# Patient Record
Sex: Female | Born: 1935 | Race: White | Hispanic: No | Smoking: Never smoker
Health system: Southern US, Community
[De-identification: ages and names within clinical notes are randomized; demographics above are authoritative.]

## PROBLEM LIST (undated history)

## (undated) DIAGNOSIS — I10 Essential (primary) hypertension: Secondary | ICD-10-CM

## (undated) DIAGNOSIS — C55 Malignant neoplasm of uterus, part unspecified: Secondary | ICD-10-CM

## (undated) DIAGNOSIS — R51 Headache: Secondary | ICD-10-CM

## (undated) DIAGNOSIS — G629 Polyneuropathy, unspecified: Secondary | ICD-10-CM

## (undated) HISTORY — PX: OTHER SURGICAL HISTORY: SHX169

## (undated) HISTORY — PX: ABDOMINAL HYSTERECTOMY: SHX81

## (undated) HISTORY — PX: VASCULAR SURGERY: SHX849

---

## 1974-07-09 HISTORY — PX: VEIN LIGATION AND STRIPPING: SHX2653

## 2006-03-18 ENCOUNTER — Ambulatory Visit (HOSPITAL_COMMUNITY): Admission: RE | Admit: 2006-03-18 | Discharge: 2006-03-18 | Payer: Self-pay | Admitting: Family Medicine

## 2006-06-21 ENCOUNTER — Ambulatory Visit: Payer: Self-pay | Admitting: Gastroenterology

## 2006-06-21 ENCOUNTER — Ambulatory Visit (HOSPITAL_COMMUNITY): Admission: RE | Admit: 2006-06-21 | Discharge: 2006-06-21 | Payer: Self-pay | Admitting: Gastroenterology

## 2008-05-19 ENCOUNTER — Ambulatory Visit (HOSPITAL_COMMUNITY): Admission: RE | Admit: 2008-05-19 | Discharge: 2008-05-19 | Payer: Self-pay | Admitting: Family Medicine

## 2008-08-21 ENCOUNTER — Emergency Department (HOSPITAL_COMMUNITY): Admission: EM | Admit: 2008-08-21 | Discharge: 2008-08-21 | Payer: Self-pay | Admitting: Emergency Medicine

## 2008-10-14 ENCOUNTER — Encounter (INDEPENDENT_AMBULATORY_CARE_PROVIDER_SITE_OTHER): Payer: Self-pay | Admitting: *Deleted

## 2008-10-14 LAB — CONVERTED CEMR LAB
BUN: 17 mg/dL
CO2: 26 meq/L
Calcium: 9.4 mg/dL
Cholesterol: 269 mg/dL
Free T4: 7.4 ng/dL
Glucose, Bld: 85 mg/dL
HDL: 36 mg/dL
Sodium: 137 meq/L

## 2008-11-11 ENCOUNTER — Emergency Department (HOSPITAL_COMMUNITY): Admission: EM | Admit: 2008-11-11 | Discharge: 2008-11-11 | Payer: Self-pay | Admitting: Emergency Medicine

## 2008-11-18 ENCOUNTER — Ambulatory Visit: Payer: Self-pay | Admitting: Cardiology

## 2008-11-25 ENCOUNTER — Encounter: Payer: Self-pay | Admitting: Physician Assistant

## 2008-11-25 ENCOUNTER — Ambulatory Visit: Payer: Self-pay | Admitting: Cardiology

## 2008-11-30 ENCOUNTER — Telehealth: Payer: Self-pay | Admitting: Cardiology

## 2008-12-09 ENCOUNTER — Encounter: Payer: Self-pay | Admitting: Physician Assistant

## 2008-12-09 ENCOUNTER — Ambulatory Visit: Payer: Self-pay | Admitting: Cardiology

## 2008-12-10 ENCOUNTER — Telehealth: Payer: Self-pay | Admitting: Cardiology

## 2008-12-16 ENCOUNTER — Ambulatory Visit: Payer: Self-pay | Admitting: Cardiology

## 2008-12-17 ENCOUNTER — Ambulatory Visit: Payer: Self-pay | Admitting: Cardiology

## 2008-12-17 ENCOUNTER — Encounter: Payer: Self-pay | Admitting: Physician Assistant

## 2008-12-18 ENCOUNTER — Telehealth: Payer: Self-pay | Admitting: Physician Assistant

## 2008-12-18 ENCOUNTER — Emergency Department (HOSPITAL_COMMUNITY): Admission: EM | Admit: 2008-12-18 | Discharge: 2008-12-18 | Payer: Self-pay | Admitting: Emergency Medicine

## 2008-12-20 ENCOUNTER — Encounter: Payer: Self-pay | Admitting: Cardiology

## 2008-12-23 ENCOUNTER — Telehealth (INDEPENDENT_AMBULATORY_CARE_PROVIDER_SITE_OTHER): Payer: Self-pay | Admitting: Nurse Practitioner

## 2008-12-28 ENCOUNTER — Ambulatory Visit (HOSPITAL_COMMUNITY): Admission: RE | Admit: 2008-12-28 | Discharge: 2008-12-28 | Payer: Self-pay | Admitting: Family Medicine

## 2008-12-30 ENCOUNTER — Telehealth: Payer: Self-pay | Admitting: Cardiology

## 2009-01-03 ENCOUNTER — Encounter: Payer: Self-pay | Admitting: Cardiology

## 2009-01-03 ENCOUNTER — Ambulatory Visit: Payer: Self-pay | Admitting: Cardiology

## 2009-01-12 ENCOUNTER — Emergency Department (HOSPITAL_COMMUNITY): Admission: EM | Admit: 2009-01-12 | Discharge: 2009-01-12 | Payer: Self-pay | Admitting: Emergency Medicine

## 2009-02-03 ENCOUNTER — Ambulatory Visit: Payer: Self-pay | Admitting: Cardiology

## 2009-02-24 ENCOUNTER — Telehealth (INDEPENDENT_AMBULATORY_CARE_PROVIDER_SITE_OTHER): Payer: Self-pay | Admitting: *Deleted

## 2009-03-01 ENCOUNTER — Telehealth: Payer: Self-pay | Admitting: Cardiology

## 2009-03-03 ENCOUNTER — Encounter: Payer: Self-pay | Admitting: Cardiology

## 2009-03-03 LAB — CONVERTED CEMR LAB
BUN: 19 mg/dL
Calcium: 8.9 mg/dL
Glucose, Bld: 117 mg/dL

## 2009-03-16 ENCOUNTER — Ambulatory Visit: Payer: Self-pay | Admitting: Cardiology

## 2009-03-16 ENCOUNTER — Encounter: Payer: Self-pay | Admitting: Adult Health

## 2009-03-21 ENCOUNTER — Ambulatory Visit: Payer: Self-pay

## 2009-03-24 ENCOUNTER — Ambulatory Visit: Payer: Self-pay | Admitting: Cardiology

## 2009-03-24 ENCOUNTER — Ambulatory Visit (HOSPITAL_COMMUNITY): Admission: RE | Admit: 2009-03-24 | Discharge: 2009-03-24 | Payer: Self-pay | Admitting: Cardiology

## 2009-03-24 ENCOUNTER — Encounter: Payer: Self-pay | Admitting: Cardiology

## 2009-04-01 ENCOUNTER — Ambulatory Visit: Payer: Self-pay | Admitting: Cardiology

## 2009-04-01 DIAGNOSIS — E785 Hyperlipidemia, unspecified: Secondary | ICD-10-CM

## 2009-04-01 DIAGNOSIS — R0789 Other chest pain: Secondary | ICD-10-CM | POA: Insufficient documentation

## 2009-04-13 ENCOUNTER — Emergency Department (HOSPITAL_COMMUNITY): Admission: EM | Admit: 2009-04-13 | Discharge: 2009-04-13 | Payer: Self-pay | Admitting: Emergency Medicine

## 2009-04-13 ENCOUNTER — Telehealth (INDEPENDENT_AMBULATORY_CARE_PROVIDER_SITE_OTHER): Payer: Self-pay

## 2009-04-17 ENCOUNTER — Emergency Department (HOSPITAL_COMMUNITY): Admission: EM | Admit: 2009-04-17 | Discharge: 2009-04-17 | Payer: Self-pay | Admitting: Emergency Medicine

## 2009-05-06 ENCOUNTER — Encounter (INDEPENDENT_AMBULATORY_CARE_PROVIDER_SITE_OTHER): Payer: Self-pay | Admitting: *Deleted

## 2009-05-06 LAB — CONVERTED CEMR LAB
BUN: 14 mg/dL
CO2: 24 meq/L
Chloride: 101 meq/L
Creatinine, Ser: 0.89 mg/dL

## 2009-05-09 ENCOUNTER — Emergency Department (HOSPITAL_COMMUNITY): Admission: EM | Admit: 2009-05-09 | Discharge: 2009-05-09 | Payer: Self-pay | Admitting: Emergency Medicine

## 2009-05-09 ENCOUNTER — Encounter (INDEPENDENT_AMBULATORY_CARE_PROVIDER_SITE_OTHER): Payer: Self-pay

## 2009-05-09 LAB — CONVERTED CEMR LAB
AST: 29 units/L
Alkaline Phosphatase: 87 units/L
CO2: 29 meq/L
Creatinine, Ser: 0.9 mg/dL
Total Protein: 7.2 g/dL
Troponin I: <0.05 ng/mL

## 2009-05-11 ENCOUNTER — Ambulatory Visit: Payer: Self-pay | Admitting: Adult Health

## 2009-05-11 ENCOUNTER — Encounter (INDEPENDENT_AMBULATORY_CARE_PROVIDER_SITE_OTHER): Payer: Self-pay

## 2009-05-20 ENCOUNTER — Ambulatory Visit: Admission: RE | Admit: 2009-05-20 | Discharge: 2009-05-20 | Payer: Self-pay | Admitting: Cardiology

## 2009-05-30 ENCOUNTER — Ambulatory Visit: Payer: Self-pay | Admitting: Pulmonary Disease

## 2009-06-15 ENCOUNTER — Encounter (INDEPENDENT_AMBULATORY_CARE_PROVIDER_SITE_OTHER): Payer: Self-pay | Admitting: *Deleted

## 2009-06-16 ENCOUNTER — Encounter (INDEPENDENT_AMBULATORY_CARE_PROVIDER_SITE_OTHER): Payer: Self-pay | Admitting: *Deleted

## 2009-06-16 ENCOUNTER — Ambulatory Visit: Payer: Self-pay | Admitting: Cardiology

## 2009-08-01 ENCOUNTER — Encounter: Payer: Self-pay | Admitting: Adult Health

## 2009-08-04 ENCOUNTER — Encounter: Payer: Self-pay | Admitting: Cardiology

## 2009-08-16 ENCOUNTER — Telehealth (INDEPENDENT_AMBULATORY_CARE_PROVIDER_SITE_OTHER): Payer: Self-pay | Admitting: *Deleted

## 2009-09-16 ENCOUNTER — Telehealth (INDEPENDENT_AMBULATORY_CARE_PROVIDER_SITE_OTHER): Payer: Self-pay | Admitting: *Deleted

## 2009-09-16 ENCOUNTER — Emergency Department (HOSPITAL_COMMUNITY): Admission: EM | Admit: 2009-09-16 | Discharge: 2009-09-16 | Payer: Self-pay | Admitting: Emergency Medicine

## 2009-11-04 ENCOUNTER — Encounter (INDEPENDENT_AMBULATORY_CARE_PROVIDER_SITE_OTHER): Payer: Self-pay | Admitting: *Deleted

## 2009-11-04 LAB — CONVERTED CEMR LAB
Alkaline Phosphatase: 76 units/L
BUN: 16 mg/dL
Calcium: 9.1 mg/dL
Creatinine, Ser: 1.09 mg/dL
Glucose, Bld: 86 mg/dL
HCT: 45.1 %
Hemoglobin: 15.1 g/dL
Platelets: 235 10*3/uL
Sodium: 140 meq/L
Total Protein: 7.2 g/dL

## 2009-11-05 ENCOUNTER — Encounter: Payer: Self-pay | Admitting: Cardiology

## 2009-11-05 LAB — CONVERTED CEMR LAB
AST: 35 units/L (ref 0–37)
Albumin: 4.2 g/dL (ref 3.5–5.2)
BUN: 16 mg/dL (ref 6–23)
Basophils Relative: 0 % (ref 0–1)
Calcium: 9.1 mg/dL (ref 8.4–10.5)
Chloride: 100 meq/L (ref 96–112)
Eosinophils Absolute: 0.1 10*3/uL (ref 0.0–0.7)
Eosinophils Relative: 2 % (ref 0–5)
HCT: 45.1 % (ref 36.0–46.0)
Hemoglobin: 15.1 g/dL — ABNORMAL HIGH (ref 12.0–15.0)
Lymphocytes Relative: 30 % (ref 12–46)
MCHC: 33.5 g/dL (ref 30.0–36.0)
Monocytes Relative: 11 % (ref 3–12)
Neutrophils Relative %: 57 % (ref 43–77)
Platelets: 235 10*3/uL (ref 150–400)
RBC: 5.04 M/uL (ref 3.87–5.11)
RDW: 12.9 % (ref 11.5–15.5)

## 2009-11-07 ENCOUNTER — Encounter (INDEPENDENT_AMBULATORY_CARE_PROVIDER_SITE_OTHER): Payer: Self-pay | Admitting: *Deleted

## 2009-11-09 IMAGING — CT CT HEAD W/O CM
1 series · 16 of 30 positions shown, 20 images · non-contrast
Comparison: None

CLINICAL DATA: Hypertension, head discomfort.

CT HEAD WITHOUT CONTRAST
TECHNIQUE: Contiguous axial images were obtained from the base of
the skull through the vertex without contrast.

[Series 2: headseq 4.8 h37s · axial · 0.45mm/px · z∈[+92,+252]mm · 16 of 36 slices shown, 20 images]
[im 2/36  brain]
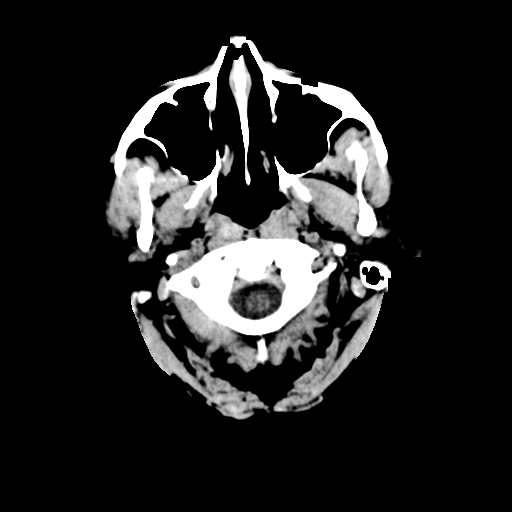
[im 2/36  bone]
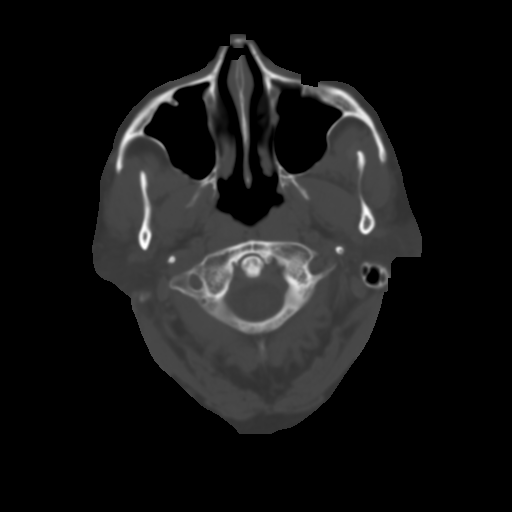
[im 4/36  brain]
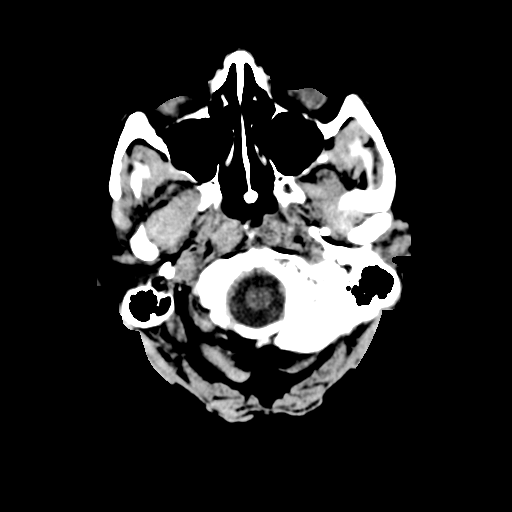
[im 7/36  brain]
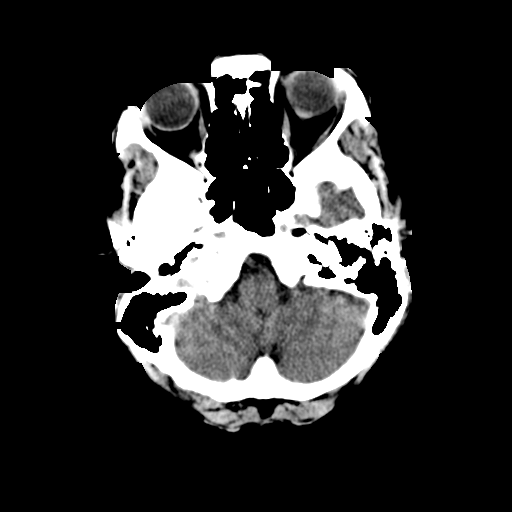
[im 9/36  brain]
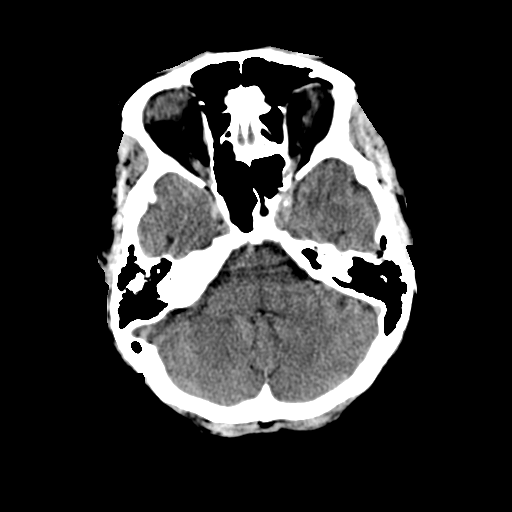
[im 10/36  brain]
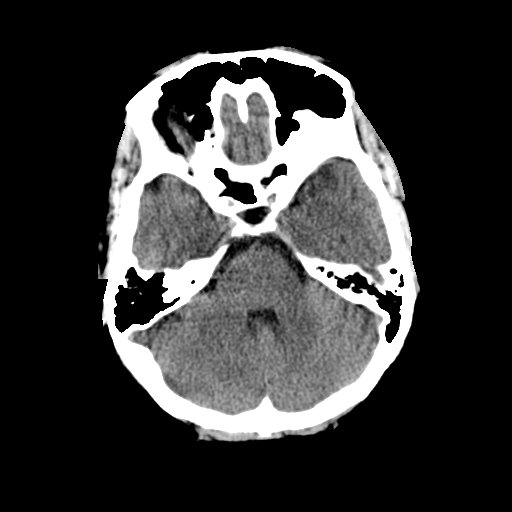
[im 10/36  bone]
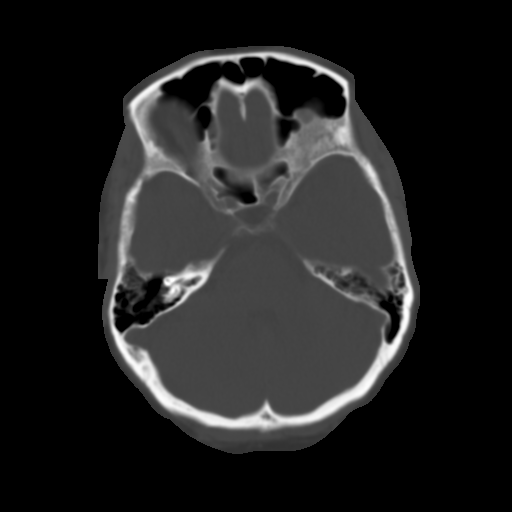
[im 13/36  brain]
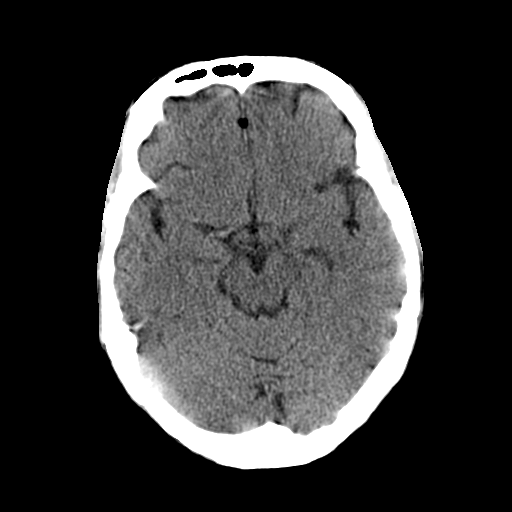
[im 15/36  brain]
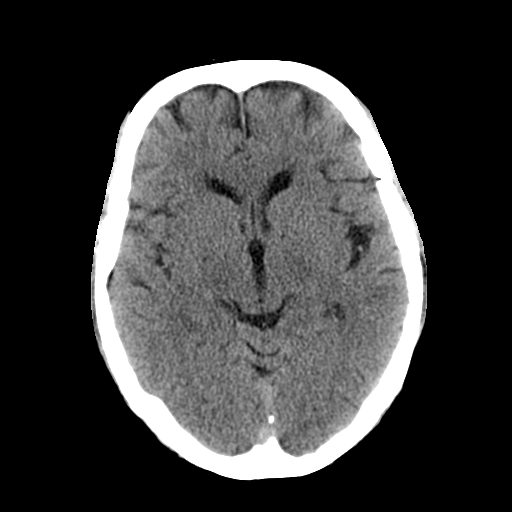
[im 17/36  brain]
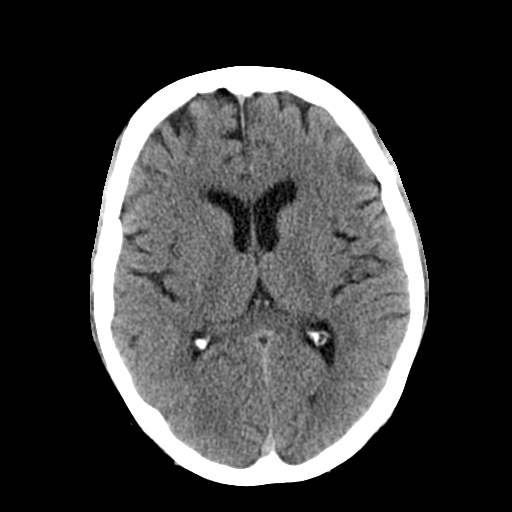
[im 19/36  brain]
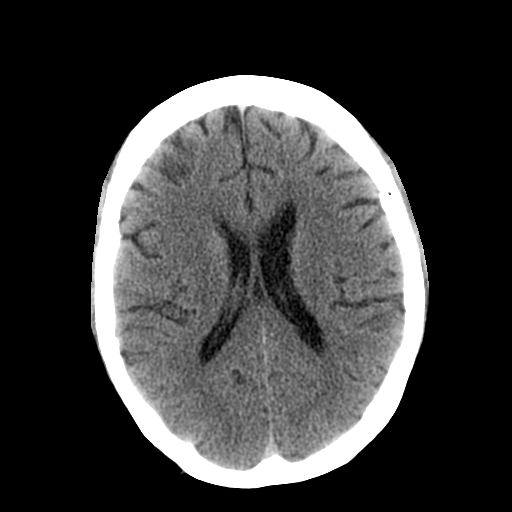
[im 19/36  bone]
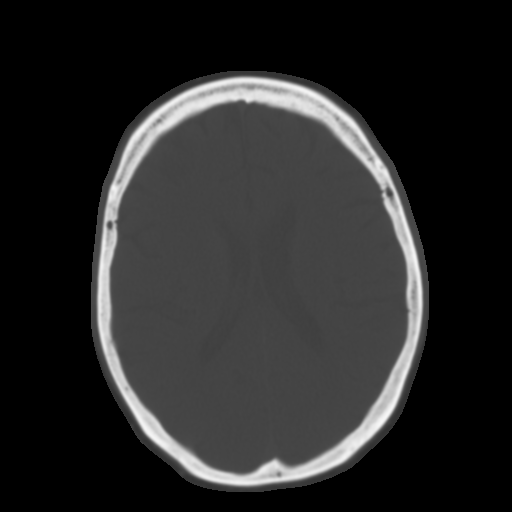
[im 21/36  brain]
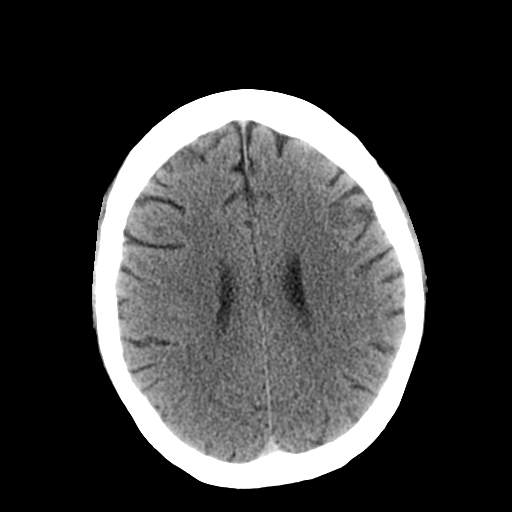
[im 23/36  brain]
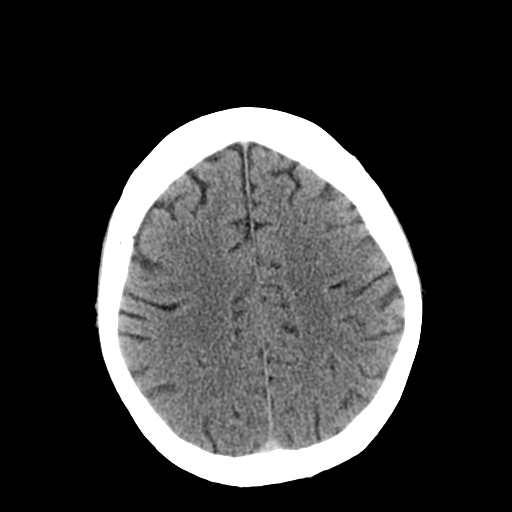
[im 26/36  brain]
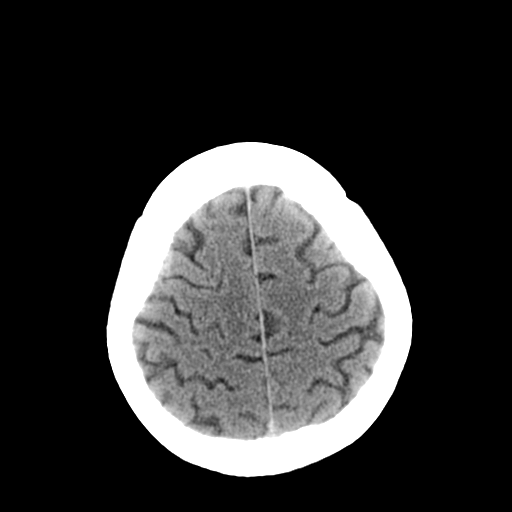
[im 27/36  brain]
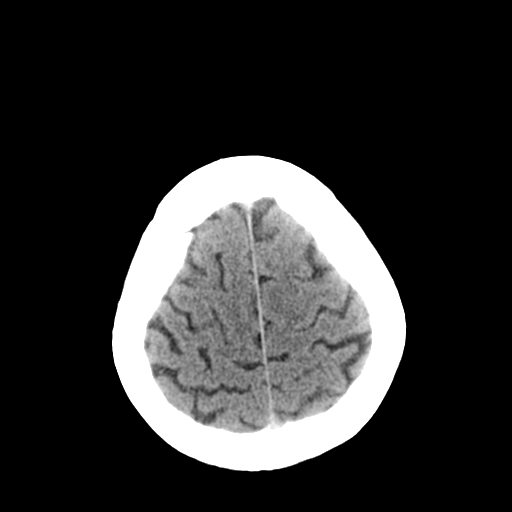
[im 27/36  bone]
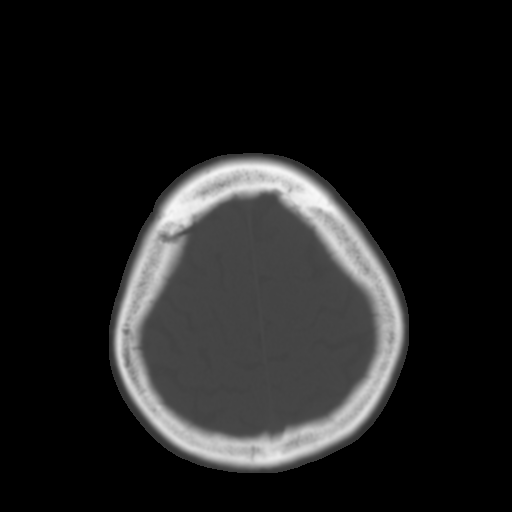
[im 29/36  brain]
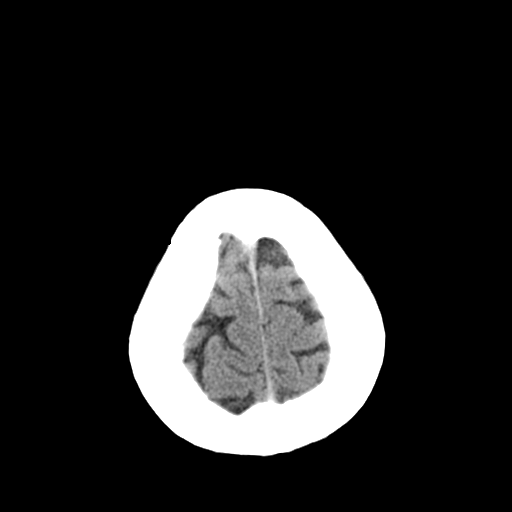
[im 32/36  brain]
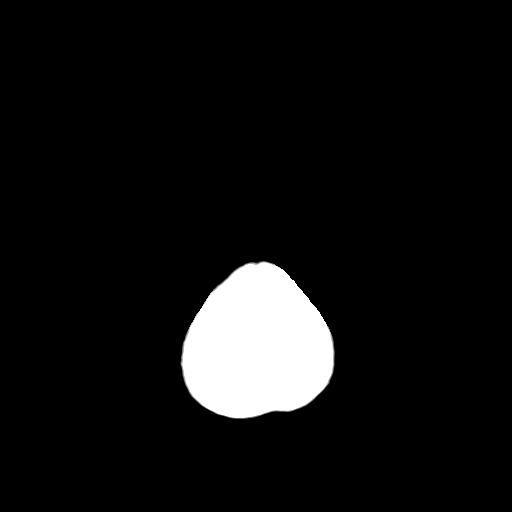
[im 34/36  brain]
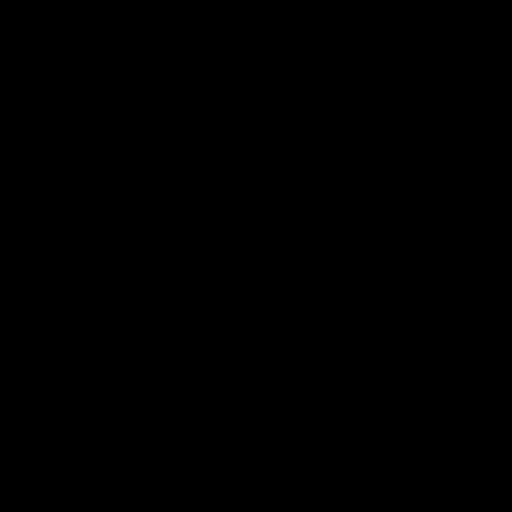

[16 of 30 positions shown; findings below may reference images not displayed]

FINDINGS: No acute intracranial abnormalities are identified,
including mass lesion or mass effect, hydrocephalus, extra-axial
fluid collection, midline shift, hemorrhage, or acute infarction.
Please note that acute infarction may be occult on CT for 24-48
hours.

The visualized bony calvarium is unremarkable.
The visualized paranasal sinuses are clear.
IMPRESSION: No evidence of acute intracranial abnormality.

## 2009-12-21 ENCOUNTER — Telehealth (INDEPENDENT_AMBULATORY_CARE_PROVIDER_SITE_OTHER): Payer: Self-pay | Admitting: *Deleted

## 2009-12-21 ENCOUNTER — Encounter (INDEPENDENT_AMBULATORY_CARE_PROVIDER_SITE_OTHER): Payer: Self-pay | Admitting: *Deleted

## 2010-02-01 ENCOUNTER — Ambulatory Visit: Payer: Self-pay | Admitting: Cardiology

## 2010-02-01 DIAGNOSIS — R42 Dizziness and giddiness: Secondary | ICD-10-CM | POA: Insufficient documentation

## 2010-02-01 DIAGNOSIS — F411 Generalized anxiety disorder: Secondary | ICD-10-CM | POA: Insufficient documentation

## 2010-06-23 ENCOUNTER — Encounter: Payer: Self-pay | Admitting: Adult Health

## 2010-08-08 NOTE — Progress Notes (Signed)
Summary: TEST RESULTS  Phone Note Call from Patient Call back at Home Phone [phone]   Caller: PT Reason for Call: Lab or Test Results Summary of Call: PT NEVER GOT LAB RESULTS FOR MAY Initial call taken by: Faythe Ghee,  December 21, 2009 10:59 AM  Follow-up for Phone Call        Phone Call Completed, results given, results letter sent Follow-up by: Teressa Lower RN,  December 21, 2009 11:04 AM

## 2010-08-08 NOTE — Letter (Signed)
Summary: [place] Results Engineer, agricultural at [facility]  [address], [place] [address]   Phone: [phone]  Fax: [phone]      August 04, 2009 MRN: [id number]   [name] [address] [place], [place]  [address]   Dear Ms. Phebus,  Your test ordered by Selena Batten has been reviewed by your physician (or physician assistant) and was found to be normal or stable. Your physician (or physician assistant) felt no changes were needed at this time.  ____ Echocardiogram  ____ Cardiac Stress Test  ____ Lab Work  ____ Peripheral vascular study of arms, legs or neck  ____ CT scan or X-ray  ____ Lung or Breathing test  __X__ Other: Sleep Study Please continue on current medical treatment.  Thank you.   Marfa Bing, MD, F.A.C.C

## 2010-08-08 NOTE — Letter (Signed)
Summary: [place] Results Engineer, agricultural at [facility]  [address], [place] [address]   Phone: [phone]  Fax: [phone]      December 21, 2009 MRN: [id number]   [name] [address] [place], [place]  [address]   Dear [name],  Your test ordered by Selena Batten has been reviewed by your physician (or physician assistant) and was found to be normal or stable. Your physician (or physician assistant) felt no changes were needed at this time.  ____ Echocardiogram  ____ Cardiac Stress Test  __x__ Lab Work  ____ Peripheral vascular study of arms, legs or neck  ____ CT scan or X-ray  ____ Lung or Breathing test  ____ Other:  No change in medical treatment at this time, per Dr. Dietrich Pates.  Enclosed is a copy of your labwork for your records.  Thank you, Haji Delaine Allyne Gee RN    Asotin Bing, MD, Lenise Arena.C.Gaylord Shih, MD, F.A.C.C Lewayne Bunting, MD, F.A.C.C Nona Dell, MD, F.A.C.C Charlton Haws, MD, Lenise Arena.C.C

## 2010-08-08 NOTE — Miscellaneous (Signed)
Summary: Sleep Study-05-20-09  Clinical Lists Changes  Observations: Added new observation of EKG INTERP: IMPRESSIONS-RECOMMENDATIONS:   1. Small numbers of obstructive events which do not meet the apnea-       hypopnea index criteria for the obstructive sleep apnea syndrome.       The patient did have loud snoring throughout the night and only       transient oxygen desaturation.   2. Occasional premature ventricular contraction but no clinically       significant arrhythmias noted.   3. Moderate numbers of periodic leg movements, none of which resulted       in arousal or awakening.  This is most likely clinically       insignificant.               Barbaraann Share, MD,FCCP   Diplomate, American Board of Sleep   Medicine   Electronically Signed  (05/20/2009 16:03)      EKG  Procedure date:  05/20/2009  Findings:      IMPRESSIONS-RECOMMENDATIONS:   1. Small numbers of obstructive events which do not meet the apnea-       hypopnea index criteria for the obstructive sleep apnea syndrome.       The patient did have loud snoring throughout the night and only       transient oxygen desaturation.   2. Occasional premature ventricular contraction but no clinically       significant arrhythmias noted.   3. Moderate numbers of periodic leg movements, none of which resulted       in arousal or awakening.  This is most likely clinically       insignificant.               Barbaraann Share, MD,FCCP   Diplomate, American Board of Sleep   Medicine   Electronically Signed

## 2010-08-08 NOTE — Assessment & Plan Note (Signed)
Summary: ROV   Visit Type:  Follow-up Primary Provider:  Dr. Viviann Spare [facility]   History of Present Illness: [name] returns as scheduled for continued assessment and treatment of hypertension.  She is more relaxed concerning her blood pressure than she has been previously and were satisfied with recent results.  Aliskirin was discontinued, as the patient was convinced that use of this medication was resulting in increased blood pressure.  She is only manipulating her medications minimally, adjusting her dose of clonidine by 0.05 mg per day depending upon blood pressure.  She has developed no new medical problems, has not been hospitalized and has not been seen in the emergency department.  She notes anxiety resulting from stress related to assisting with the care and treatment of her daughter who has severe hepatitis C and is on a transplant list.  Preventive Screening-Counseling & Management  Alcohol-Tobacco     Smoking Status: never  Current Medications (verified): 1)  Aspirin 81 Mg Tbec (Aspirin) .... Take One Tablet By Mouth Daily 2)  Clonidine Hcl 0.1 Mg Tabs (Clonidine Hcl) .... Take 1 Am 1/2 Pm 3)  Ativan 0.5 Mg Tabs (Lorazepam) .... As Needed 4)  Benicar Hct 40-12.5 Mg Tabs (Olmesartan Medoxomil-Hctz) .Marland Kitchen.. 1 Tab Everyday At Lunch Time. 5)  Abc Plus  Tabs (Multiple Vitamins-Minerals) .Marland Kitchen.. 1 Tab Daily 6)  Fish Oil  Oil (Fish Oil) .... One Tablet By Mouth Twice A Day 7)  Zocor 20 Mg Tabs (Simvastatin) .Marland Kitchen.. 1 Tab Once Daily  Allergies (verified): 1)  ! * Decongestants 2)  ! Sulfa 3)  ! * Exforge  Past History:  PMH, FH, and Social History reviewed and updated.  Past Medical History: Hypertension Dyslipidemia Allergic Rhinitis Gastroesophageal reflux disease  Social History: Smoking Status:  never  Vital Signs:  Patient profile:   75 year old female Weight:      227 pounds BMI:     41.67 Pulse rate:   70 / minute BP sitting:   131 / 73  (right  arm)  Vitals Entered By: Dreama Saa, CNA (February 01, 2010 1:55 PM)  Physical Exam  General:  Obese; well developed; no acute distress;   Neck-No JVD, no carotid bruits: Lungs-No tachypnea, no rales; no rhonchi;no wheezes:  Cardiovascular-normal PMI; normal S1 and S2; S4 and modest systolic murmur present Abdomen-BS normal; soft and non-tender without masses or organomegaly:  Skin-Warm, no significant lesions: Extremities-Nl distal pulses; trace ankle edema:    Impression & Recommendations:  Problem # 1:  HYPERTENSION (ICD-401.1) Blood pressure control is good, and patient is satisfied with her current regime.  She does describe mild sedation with clonidine, but is willing to live with this.  Should that change, I would substitute transdermal clonidine at the lowest dose.  [name] also complains of postnasal drip, but is not inclined to use an antihistamine because she fears additive sedation.  I directed her to Allegra and Claritin over-the-counter suggesting that these may well not affect her level of alertness at all.  I do not believe this nice woman requires routine cardiology followup, but will be available to see her at any time her physicians deem appropriate.  Patient Instructions: 1)  Your physician recommends that you schedule a follow-up appointment in: as needed  2)  Your physician recommends that you continue on your current medications as directed. Please refer to the Current Medication list given to you today.   Prevention & Chronic Care Immunizations   Influenza vaccine: Not documented  Tetanus booster: Not documented    Pneumococcal vaccine: Not documented    H. zoster vaccine: Not documented  Colorectal Screening   Hemoccult: Not documented    Colonoscopy: Not documented  Other Screening   Pap smear: Not documented    Mammogram: Not documented    DXA bone density scan: Not documented   Smoking status: never  (02/01/2010)  Lipids   Total  Cholesterol: 269  (10/14/2008)   LDL: Not documented   LDL Direct: Not documented   HDL: 36  (10/14/2008)   Triglycerides: 448  (10/14/2008)    SGOT (AST): 35  (11/05/2009)   SGPT (ALT): 42  (11/05/2009)   Alkaline phosphatase: 76  (11/05/2009)   Total bilirubin: 0.6  (11/05/2009)  Hypertension   Last Blood Pressure: 131 / 73  (02/01/2010)   Serum creatinine: 1.09  (11/05/2009)   Serum potassium 4.0  (11/05/2009)  Self-Management Support :    Hypertension self-management support: Not documented    Lipid self-management support: Not documented

## 2010-08-08 NOTE — Miscellaneous (Signed)
Summary: LABS CBCD,CMP,11/04/2009  Clinical Lists Changes  Observations: Added new observation of CALCIUM: 9.1 mg/dL (16/04/9603 54:09) Added new observation of ALBUMIN: 4.2 g/dL (81/19/1478 29:56) Added new observation of PROTEIN, TOT: 7.2 g/dL (21/30/8657 84:69) Added new observation of SGPT (ALT): 42 units/L (11/04/2009 10:35) Added new observation of SGOT (AST): 35 units/L (11/04/2009 10:35) Added new observation of ALK PHOS: 76 units/L (11/04/2009 10:35) Added new observation of CREATININE: 1.09 mg/dL (62/95/2841 32:44) Added new observation of BUN: 16 mg/dL (07/11/7251 66:44) Added new observation of BG RANDOM: 86 mg/dL (03/47/4259 56:38) Added new observation of CO2 PLSM/SER: 26 meq/L (11/04/2009 10:35) Added new observation of CL SERUM: 100 meq/L (11/04/2009 10:35) Added new observation of K SERUM: 4.0 meq/L (11/04/2009 10:35) Added new observation of NA: 140 meq/L (11/04/2009 10:35) Added new observation of PLATELETK/UL: 235 K/uL (11/04/2009 10:35) Added new observation of MCV: 89.5 fL (11/04/2009 10:35) Added new observation of HCT: 45.1 % (11/04/2009 10:35) Added new observation of HGB: 15.1 g/dL (75/64/3329 51:88) Added new observation of WBC COUNT: 5.7 10*3/microliter (11/04/2009 10:35)

## 2010-08-08 NOTE — Progress Notes (Signed)
Summary: questions about meds  Phone Note Call from Patient Call back at Home Phone [phone]   Caller: pt Reason for Call: Talk to Nurse Summary of Call: patient has been having stomic issues and wants to know if it could be due to some of the meds she is on. Initial call taken by: Faythe Ghee,  August 16, 2009 10:37 AM  Follow-up for Phone Call        Patient states that she has been having intestinal pain that has kept her up most all night the past couple of nights, she is so weak she can hardly get up out of a chair and elevated BP. Mrs. Norby states that she called her PCP (Dr. Leandrew Koyanagi) and was advised to call us due to PCP leaving early today. She thinks s/s may be caused from [place]. Please advise. Also, patient was advised to go to ED if pain worsens. Follow-up by: Larita Fife Via LPN,  August 16, 2009 11:25 AM  Additional Follow-up for Phone Call Additional follow up Details #1::        She needs to be seen by her PMD or Gastroenterologist. Additional Follow-up by: Kathlen Brunswick, MD, [facility],  August 16, 2009 4:25 PM    Additional Follow-up for Phone Call Additional follow up Details #2::    Patient advised. She states she just returned from ED at [facility]. where she was also advised to see her PCP and Gastroenterologist. Is seeing PCP in 2 days. Follow-up by: Larita Fife Via LPN,  August 16, 2009 4:47 PM

## 2010-08-08 NOTE — Progress Notes (Signed)
Summary: bp 190/100  Phone Note Call from Patient Call back at Home Phone [phone]   Reason for Call: Talk to Nurse Summary of Call: BP has been high 190/100 at the highest. Initial call taken by: Faythe Ghee,  September 16, 2009 12:17 PM  Follow-up for Phone Call        bp at this time 160/73, 65,   pt has the most problems at night and when trying to go to sleep.   Pt sets her clock and gets up every 4 hrs at night.  She has a new pcp Dr. Leandrew [facility]agi [facility] in [place]. Pt feels weak and unable to stand still. Follow-up by: Teressa Lower RN,  September 16, 2009 4:55 PM  Additional Follow-up for Phone Call Additional follow up Details #1::        I encouraged pt no only take her clonidine as prescribed and to come to ED this weekend if she felt her bp was out of control Additional Follow-up by: Teressa Lower RN,  September 16, 2009 5:00 PM     Appended Document: bp 190/100 pt still having trouble walking and weakness, questions about switching meds  Appended Document: bp 190/100 Pt states that pcp will not handle blood pressure issues without consent from cardiologist.  Pt will not be able to be seen in this office until May, pt may be treated by pcp for bp issues until seen in our office in May

## 2010-08-10 NOTE — Miscellaneous (Signed)
  Clinical Lists Changes  Medications: Changed medication from BENICAR HCT 40-12.5 MG TABS (OLMESARTAN MEDOXOMIL-HCTZ) 1 tab everyday at lunch time. to LOSARTAN POTASSIUM-HCTZ 50-12.5 MG TABS (LOSARTAN POTASSIUM-HCTZ) Take 1 tablet by mouth once a day - Signed Rx of LOSARTAN POTASSIUM-HCTZ 50-12.5 MG TABS (LOSARTAN POTASSIUM-HCTZ) Take 1 tablet by mouth once a day;  #30 x 3;  Signed;  Entered by: Fuller Plan CMA;  Authorized by: Joni Reining, NP;  Method used: Electronically to [facility] [address], [place], [place], [place]  [address], Ph: [phone] or [phone], Fax: [phone]    Prescriptions: LOSARTAN POTASSIUM-HCTZ 50-12.5 MG TABS (LOSARTAN POTASSIUM-HCTZ) Take 1 tablet by mouth once a day  #30 x 3   Entered by:   Fuller Plan CMA   Authorized by:   Joni Reining, NP   Signed by:   Fuller Plan CMA on 06/23/2010   Method used:   Electronically to        [facility] [address] (retail)       [address]       [place], [place]  [address]       Ph: [phone] or [phone]       Fax: [phone]   RxID:   [id number]

## 2010-10-11 LAB — COMPREHENSIVE METABOLIC PANEL
AST: 29 U/L (ref 0–37)
Albumin: 3.7 g/dL (ref 3.5–5.2)
CO2: 29 mEq/L (ref 19–32)
GFR calc non Af Amer: >60 mL/min (ref >60)
Glucose, Bld: 111 mg/dL — ABNORMAL HIGH (ref 70–99)
Potassium: 4.5 mEq/L (ref 3.5–5.1)
Sodium: 137 mEq/L (ref 135–145)
Total Bilirubin: 0.8 mg/dL (ref 0.3–1.2)

## 2010-10-11 LAB — POCT CARDIAC MARKERS
CKMB, poc: 1.2 ng/mL (ref 1.0–8.0)
Troponin i, poc: <0.05 ng/mL (ref 0.00–0.09)

## 2010-10-12 LAB — URINALYSIS, ROUTINE W REFLEX MICROSCOPIC
Bilirubin Urine: NEGATIVE
Glucose, UA: NEGATIVE mg/dL
Ketones, ur: NEGATIVE mg/dL
Nitrite: NEGATIVE
Protein, ur: NEGATIVE mg/dL
Specific Gravity, Urine: 1.01 (ref 1.005–1.030)
Urobilinogen, UA: 0.2 mg/dL (ref 0.0–1.0)
pH: 6.5 (ref 5.0–8.0)

## 2010-10-12 LAB — URINE CULTURE: Colony Count: 60000

## 2010-10-12 LAB — POCT I-STAT, CHEM 8
BUN: 16 mg/dL (ref 6–23)
Calcium, Ion: 1.01 mmol/L — ABNORMAL LOW (ref 1.12–1.32)
Chloride: 108 mEq/L (ref 96–112)
Glucose, Bld: 107 mg/dL — ABNORMAL HIGH (ref 70–99)
HCT: 44 % (ref 36.0–46.0)
Sodium: 138 mEq/L (ref 135–145)
TCO2: 25 mmol/L (ref 0–100)

## 2010-10-12 LAB — POCT CARDIAC MARKERS
CKMB, poc: 1.2 ng/mL (ref 1.0–8.0)
Myoglobin, poc: 87.6 ng/mL (ref 12–200)
Troponin i, poc: <0.05 ng/mL (ref 0.00–0.09)

## 2010-10-12 LAB — URINE MICROSCOPIC-ADD ON

## 2010-10-16 LAB — CBC
HCT: 44.8 % (ref 36.0–46.0)
Hemoglobin: 15.9 g/dL — ABNORMAL HIGH (ref 12.0–15.0)
MCHC: 35.4 g/dL (ref 30.0–36.0)
Platelets: 261 10*3/uL (ref 150–400)
RDW: 13.5 % (ref 11.5–15.5)
WBC: 7.5 10*3/uL (ref 4.0–10.5)

## 2010-10-16 LAB — DIFFERENTIAL
Basophils Relative: 0 % (ref 0–1)
Lymphocytes Relative: 20 % (ref 12–46)
Monocytes Absolute: 0.5 10*3/uL (ref 0.1–1.0)
Monocytes Relative: 7 % (ref 3–12)
Neutro Abs: 5.4 10*3/uL (ref 1.7–7.7)
Neutrophils Relative %: 72 % (ref 43–77)

## 2010-10-16 LAB — URINALYSIS, ROUTINE W REFLEX MICROSCOPIC
Ketones, ur: NEGATIVE mg/dL
Protein, ur: NEGATIVE mg/dL
Specific Gravity, Urine: 1.01 (ref 1.005–1.030)
Urobilinogen, UA: 0.2 mg/dL (ref 0.0–1.0)

## 2010-10-16 LAB — URINE MICROSCOPIC-ADD ON

## 2010-10-16 LAB — BASIC METABOLIC PANEL: Glucose, Bld: 102 mg/dL — ABNORMAL HIGH (ref 70–99)

## 2010-10-24 LAB — COMPREHENSIVE METABOLIC PANEL
Alkaline Phosphatase: 112 U/L (ref 39–117)
BUN: 17 mg/dL (ref 6–23)
Chloride: 104 mEq/L (ref 96–112)
Creatinine, Ser: 0.97 mg/dL (ref 0.4–1.2)
GFR calc non Af Amer: 57 mL/min — ABNORMAL LOW (ref >60)
Glucose, Bld: 118 mg/dL — ABNORMAL HIGH (ref 70–99)
Potassium: 4.6 mEq/L (ref 3.5–5.1)
Total Bilirubin: 0.5 mg/dL (ref 0.3–1.2)

## 2010-10-24 LAB — URINALYSIS, ROUTINE W REFLEX MICROSCOPIC
Bilirubin Urine: NEGATIVE
Hgb urine dipstick: NEGATIVE
Nitrite: NEGATIVE
Specific Gravity, Urine: 1.02 (ref 1.005–1.030)
Urobilinogen, UA: 0.2 mg/dL (ref 0.0–1.0)
pH: 5.5 (ref 5.0–8.0)

## 2010-10-24 LAB — URINE CULTURE

## 2010-10-24 LAB — POCT CARDIAC MARKERS
CKMB, poc: <1 ng/mL — ABNORMAL LOW (ref 1.0–8.0)
CKMB, poc: <1 ng/mL — ABNORMAL LOW (ref 1.0–8.0)
Myoglobin, poc: 75 ng/mL (ref 12–200)

## 2010-10-24 LAB — DIFFERENTIAL
Basophils Absolute: 0 10*3/uL (ref 0.0–0.1)
Basophils Relative: 0 % (ref 0–1)
Neutro Abs: 5.1 10*3/uL (ref 1.7–7.7)
Neutrophils Relative %: 72 % (ref 43–77)

## 2010-10-24 LAB — CBC
HCT: 47.4 % — ABNORMAL HIGH (ref 36.0–46.0)
Hemoglobin: 15.9 g/dL — ABNORMAL HIGH (ref 12.0–15.0)
MCV: 87.4 fL (ref 78.0–100.0)
WBC: 7.1 10*3/uL (ref 4.0–10.5)

## 2010-10-24 LAB — URINE MICROSCOPIC-ADD ON

## 2010-10-26 ENCOUNTER — Other Ambulatory Visit: Payer: Self-pay | Admitting: Adult Health

## 2010-11-21 NOTE — Letter (Signed)
February 03, 2009    Angus G. Renard Matter, MD  [address]  [place] [address]   RE:  JENSYN, SHAVE  MRN:  [id number]  /  DOB:  Jun 07, 1936   Dear Thalia Party,   Ms. Rickerson returns to the office for continued assessment and treatment  of hypertension.  Since her last visit, she has done better.  Most of  the values she obtains at home had been normal.  She uses clonidine on a  p.r.n. basis and has only done so on a few occasions.  She reports some  nasal congestion today, but attributes that to allergies.  She does not  appear to be experiencing any adverse effects to her current  antihypertensive medications, which include Benicar/HCT 40/12.5 mg  daily, Tekturna 150 mg daily, clonidine 0.1 mg b.i.d. p.r.n.  She also  reports that she continues to take metoprolol 25 mg b.i.d., although she  complained of adverse effects three visits ago and was advised to stop  that medication.   PHYSICAL EXAMINATION:  GENERAL:  Very pleasant, talkative woman in no  acute distress.  VITAL SIGNS:  The weight is 226, unchanged.  Blood pressure 125/65,  heart rate 70 and regular, respirations 12 and unlabored.  NECK:  No jugular venous distention; no carotid bruits.  LUNGS:  Clear.  CARDIAC:  Normal first and second heart sounds; fourth heart sound  present.  ABDOMEN:  Soft and nontender; no organomegaly.  EXTREMITIES:  No edema.   IMPRESSION:  Ms. Vanderloop appears to be doing well on her current regime.  I have not scheduled a routine followup visit, but have advised her to  call me at anytime that she has further problems with her blood  pressure.  Likewise, please let me know if I can do anything more to  assist in her care.    Sincerely,      Gerrit Friends. Dietrich Pates, MD, [facility]  Electronically Signed    RMR/MedQ  DD: 02/03/2009  DT: 02/04/2009  Job #: [id number]

## 2010-11-21 NOTE — Assessment & Plan Note (Signed)
[facility]                                 ON-CALL NOTE   [name], [name]                      MRN:          [id number]  DATE:04/13/2009                            DOB:          [birth year]    I was contacted by [name] because of elevated systolic blood  pressures at home.  In looking in the computer, she is a 76 year old  female with a history of hypertension, who sees Dr. Dietrich Pates.  She  reports taking clonidine 0.1 mg b.i.d. as well as Benicar 40 mg daily.  This evening, her blood pressure was approximately 180.  Later this  evening, has increased blood pressure to 217/110 and asking about her  symptoms, she states that she feels terrible.  She has a burning in  her head as well as mild chest discomfort and shortness of breath.  I  instructed her that given these symptoms associated with severe  hypertension, she needs to be seen and evaluated in the emergency  department.  She will be calling emergency medical services who will be  transferring her to the emergency department and further recommendations  will be made pending the emergency department evaluation.     Therisa Doyne, MD    SJT/MedQ  DD: 04/13/2009  DT: 04/13/2009  Job #: [id number]

## 2010-11-21 NOTE — Letter (Signed)
Nov 18, 2008    Angus G. Renard Matter, MD  [address]  [place], [place] [address]   RE:  [name], [name]  MRN:  [id number]  /  DOB:  04-13-1936   Dear Thalia Party,   It was my pleasure evaluating Ms. Couchman in the office today in  consultation at your request for hypertension out of control.  I first  saw this nice woman in 2004, when she had been admitted to [facility] with chest pain.  Myocardial infarction had been ruled out.  I  scheduled her for stress testing, but she never returned for her study  or followup.  She apparently has done well since then with no  significant major medical problems.  Blood pressure control was good  until recent weeks when she has noted elevation, particularly in the  early morning hours.  She recently was evaluated in the emergency  department for this problem.  She has been on multiple medications of  late including Exforge, which resulted in myalgias, Benicar,  hydrochlorothiazide, which caused dry mouth and clonidine p.r.n.  She  was given a prescription for metoprolol 25 mg b.i.d. last week, but has  not yet started it.  She sought consultation with her daughter's  physician, who advised her that her current therapy appear to be  appropriate.  Consideration of pheochromocytoma and renal artery  stenosis has been raised, but no testing has yet been performed for  these entities.  Ms. Niday reports being symptomatic when her blood  pressure is very high, but her symptoms seemed to be anxiety related to  her concern about her blood pressure.  She has not had headaches, visual  disturbance, renal insufficiency nor any other hypertension-related  morbidity.  Family member recently suffered a CVA.  She is concerned  about this possibly happening to her.   ALLERGIES:  She reports multiple allergies including to DECONGESTANTS,  SULFA DRUGS, and EXFORGE.   CURRENT MEDICATIONS:  Simvastatin 20 mg daily,  Benicar/hydrochlorothiazide 40/12.5 mg  daily, aspirin 81 mg daily, and a  multivitamin.  She uses lorazepam 0.5 mg p.r.n.Marland Kitchen   PAST MEDICAL HISTORY:  Otherwise notable for GERD and allergic rhinitis.   PAST SURGICAL HISTORY:  Her only surgery was a minor orthopedic  procedure on her heel in 1992.   SOCIAL HISTORY:  Widowed, with three adult children.  No use of tobacco  products nor alcohol.   FAMILY HISTORY:  Father died due to respiratory failure; mother had  senile dementia.   REVIEW OF SYSTEMS:  Notable for occasional dizziness, sensation of  warmth in her face when her blood pressure is elevated, the need for  corrective lenses, class II exertional dyspnea, and intermittent mild  pedal edema.  She reports an allergy to MOLD.  All other systems  reviewed and are negative.   PHYSICAL EXAMINATION:  GENERAL:  Overwrought overweight pleasant woman  in no acute distress.  VITAL SIGNS:  The weight is 224 pounds, 6 pounds more than 6 years ago.  Blood pressure 145/85, heart rate 75 and regular, respirations 14 and  unlabored.  HEENT:  Anicteric sclerae; normal lids and conjunctivae.  NECK:  No jugular venous distention; normal carotid upstrokes without  bruits.  LUNGS:  Clear.  CARDIAC:  Normal first and second heart sounds; fourth heart sound  present.  ABDOMEN:  Soft and nontender; normal bowel sounds; no masses; no  organomegaly.  EXTREMITIES:  Distal pulses intact; no edema.  NEUROLOGIC:  Symmetric strength and tone;  normal cranial nerves.  SKIN:  No significant lesions.   LABORATORY DATA:  No recent laboratory data available; records requested  from Dr. Renard Matter.   IMPRESSION:  Ms. Bo has longstanding hypertension that has escaped  control.  While pheochromocytoma or renal artery stenosis are certainly  possible explanations, statistically, this probably simply represents  progression of her essential hypertension.  She will start metoprolol 25  mg b.i.d. as you have advised.  She will continue to take her  blood  pressure at home, but will come to see the Cardiology nurse once a week  for downloading of her blood pressure values  and verification of the accuracy of her device.  I will adjust her  medication as required and plan to see this nice woman again in 3 weeks.  She will not take clonidine or antihistamines for the time being, as  these are what probably caused her dry mouth, not an increased dose of  hydrochlorothiazide.    Sincerely,      Gerrit Friends. Dietrich Pates, MD, [facility]  Electronically Signed    RMR/MedQ  DD: 11/18/2008  DT: 11/19/2008  Job #: [id number]

## 2010-11-24 NOTE — Op Note (Signed)
NAMEEMA, HEBNER               ACCOUNT NO.:  [id number]   MEDICAL RECORD NO.:  [id number]          PATIENT TYPE:  AMB   LOCATION:  DAY                           FACILITY:  [facility]   PHYSICIAN:  Kassie Mends, M.D.      DATE OF BIRTH:  25-Jun-1936   DATE OF PROCEDURE:  06/21/2006  DATE OF DISCHARGE:                               OPERATIVE REPORT   OPERATION/PROCEDURE:  Colonoscopy.   INDICATIONS:  Mrs. [name] is a 75 year old female who presents for  average-risk colon cancer screening.   FINDINGS:  1. Sigmoid colon diverticulosis.  Otherwise normal colon without      evidence of polyps, masses, inflammatory changes, or AVMs.  2. Normal retroflexed view of the rectum.   RECOMMENDATIONS:  1. High fiber diet.  Handout given on high fiber diet and      diverticulosis.  2. Screening colonoscopy in 10 years.  3. Followup with Dr. Renard Matter.   MEDICATIONS:  1. Demerol 75 mg IV.  2. Versed 3 mg IV.   DESCRIPTION OF PROCEDURE:  The physical examination was performed and  informed consent was obtained from the patient after explaining the  benefits, risks and alternatives to the procedure.  The patient was  connected to the monitor and placed in the left lateral position.  Continuous oxygen was provided by nasal cannula and IV medicine  administered through an indwelling cannula.  After administration of  sedation and the rectal exam, the patient's rectum was intubated and  scope was advanced under direct visualization to the cecum.  The scope  was subsequently removed slowly by carefully examining color, texture,  anatomy, and integrity of the mucosa on the way out.  The patient was  recovered in the endoscopy suite and discharged to home in satisfactory  condition.      Kassie Mends, M.D.  Electronically Signed    SM/MEDQ  D:  06/21/2006  T:  06/21/2006  Job:  [id number]   cc:   Angus G. Renard Matter, MD  Fax: [phone]

## 2011-02-21 ENCOUNTER — Other Ambulatory Visit: Payer: Self-pay | Admitting: Cardiology

## 2011-06-13 ENCOUNTER — Observation Stay (HOSPITAL_COMMUNITY)
Admission: EM | Admit: 2011-06-13 | Discharge: 2011-06-17 | Disposition: A | Discharge status: Home / Self Care | Payer: Medicare Other | Attending: Internal Medicine | Admitting: Internal Medicine

## 2011-06-13 DIAGNOSIS — E785 Hyperlipidemia, unspecified: Secondary | ICD-10-CM | POA: Insufficient documentation

## 2011-06-13 DIAGNOSIS — G08 Intracranial and intraspinal phlebitis and thrombophlebitis: Secondary | ICD-10-CM | POA: Insufficient documentation

## 2011-06-13 DIAGNOSIS — H4920 Sixth [abducent] nerve palsy, unspecified eye: Secondary | ICD-10-CM | POA: Diagnosis present

## 2011-06-13 DIAGNOSIS — R51 Headache: Secondary | ICD-10-CM | POA: Insufficient documentation

## 2011-06-13 DIAGNOSIS — J323 Chronic sphenoidal sinusitis: Principal | ICD-10-CM | POA: Insufficient documentation

## 2011-06-13 DIAGNOSIS — C55 Malignant neoplasm of uterus, part unspecified: Secondary | ICD-10-CM | POA: Insufficient documentation

## 2011-06-13 DIAGNOSIS — J329 Chronic sinusitis, unspecified: Secondary | ICD-10-CM

## 2011-06-13 DIAGNOSIS — C313 Malignant neoplasm of sphenoid sinus: Secondary | ICD-10-CM | POA: Insufficient documentation

## 2011-06-13 DIAGNOSIS — H538 Other visual disturbances: Secondary | ICD-10-CM | POA: Insufficient documentation

## 2011-06-13 DIAGNOSIS — Z792 Long term (current) use of antibiotics: Secondary | ICD-10-CM | POA: Insufficient documentation

## 2011-06-13 DIAGNOSIS — D491 Neoplasm of unspecified behavior of respiratory system: Secondary | ICD-10-CM | POA: Diagnosis present

## 2011-06-13 DIAGNOSIS — R519 Headache, unspecified: Secondary | ICD-10-CM | POA: Diagnosis present

## 2011-06-13 DIAGNOSIS — Z79899 Other long term (current) drug therapy: Secondary | ICD-10-CM | POA: Insufficient documentation

## 2011-06-13 DIAGNOSIS — I10 Essential (primary) hypertension: Secondary | ICD-10-CM | POA: Diagnosis present

## 2011-06-13 HISTORY — DX: Polyneuropathy, unspecified: G62.9

## 2011-06-13 HISTORY — DX: Headache: R51

## 2011-06-13 HISTORY — DX: Malignant neoplasm of uterus, part unspecified: C55

## 2011-06-13 HISTORY — DX: Essential (primary) hypertension: I10

## 2011-06-14 ENCOUNTER — Other Ambulatory Visit (HOSPITAL_COMMUNITY): Payer: PRIVATE HEALTH INSURANCE

## 2011-06-14 ENCOUNTER — Observation Stay (HOSPITAL_COMMUNITY): Payer: Medicare Other

## 2011-06-14 ENCOUNTER — Encounter: Payer: Self-pay | Admitting: Emergency Medicine

## 2011-06-14 ENCOUNTER — Other Ambulatory Visit: Payer: Self-pay

## 2011-06-14 ENCOUNTER — Emergency Department (HOSPITAL_COMMUNITY): Payer: Medicare Other

## 2011-06-14 DIAGNOSIS — R519 Headache, unspecified: Secondary | ICD-10-CM | POA: Diagnosis present

## 2011-06-14 DIAGNOSIS — R51 Headache: Secondary | ICD-10-CM | POA: Diagnosis present

## 2011-06-14 DIAGNOSIS — H4920 Sixth [abducent] nerve palsy, unspecified eye: Secondary | ICD-10-CM | POA: Diagnosis present

## 2011-06-14 DIAGNOSIS — I10 Essential (primary) hypertension: Secondary | ICD-10-CM | POA: Diagnosis present

## 2011-06-14 LAB — COMPREHENSIVE METABOLIC PANEL
Alkaline Phosphatase: 140 U/L — ABNORMAL HIGH (ref 39–117)
BUN: 14 mg/dL (ref 6–23)
Chloride: 95 mEq/L — ABNORMAL LOW (ref 96–112)
GFR calc Af Amer: 79 mL/min — ABNORMAL LOW (ref >90)
Glucose, Bld: 102 mg/dL — ABNORMAL HIGH (ref 70–99)
Potassium: 3.7 mEq/L (ref 3.5–5.1)
Total Bilirubin: 0.5 mg/dL (ref 0.3–1.2)
Total Protein: 7.1 g/dL (ref 6.0–8.3)

## 2011-06-14 LAB — CBC
HCT: 40.2 % (ref 36.0–46.0)
Hemoglobin: 14.6 g/dL (ref 12.0–15.0)
MCH: 31.2 pg (ref 26.0–34.0)
RBC: 4.68 MIL/uL (ref 3.87–5.11)

## 2011-06-14 LAB — TSH: TSH: 2.294 u[IU]/mL (ref 0.350–4.500)

## 2011-06-14 LAB — DIFFERENTIAL
Eosinophils Absolute: 0.4 10*3/uL (ref 0.0–0.7)
Lymphs Abs: 2 10*3/uL (ref 0.7–4.0)
Monocytes Relative: 7 % (ref 3–12)
Neutrophils Relative %: 67 % (ref 43–77)

## 2011-06-14 MED ORDER — SIMVASTATIN 20 MG PO TABS
20.0000 mg | ORAL_TABLET | Freq: Every day | ORAL | Status: DC
  Administered 2011-06-14: 20 mg via ORAL
  Filled 2011-06-14 (×2): qty 1

## 2011-06-14 MED ORDER — OXYCODONE HCL 5 MG PO TABS: 5.0000 mg | ORAL_TABLET | ORAL | Status: DC | PRN

## 2011-06-14 MED ORDER — OMEGA-3 FATTY ACIDS 1000 MG PO CAPS: 1.0000 g | ORAL_CAPSULE | Freq: Two times a day (BID) | ORAL | Status: DC

## 2011-06-14 MED ORDER — LOSARTAN POTASSIUM 50 MG PO TABS
50.0000 mg | ORAL_TABLET | Freq: Every day | ORAL | Status: DC
  Administered 2011-06-14 – 2011-06-16 (×3): 50 mg via ORAL
  Filled 2011-06-14 (×4): qty 1

## 2011-06-14 MED ORDER — THERA M PLUS PO TABS
1.0000 | ORAL_TABLET | Freq: Every day | ORAL | Status: DC
  Administered 2011-06-14 – 2011-06-17 (×3): 1 via ORAL
  Filled 2011-06-14 (×4): qty 1

## 2011-06-14 MED ORDER — OXYCODONE-ACETAMINOPHEN 5-325 MG PO TABS
2.0000 | ORAL_TABLET | Freq: Once | ORAL | Status: AC
  Administered 2011-06-14: 2 via ORAL
  Filled 2011-06-14: qty 2

## 2011-06-14 MED ORDER — FLUTICASONE PROPIONATE 50 MCG/ACT NA SUSP
2.0000 | Freq: Every day | NASAL | Status: DC
  Administered 2011-06-14 – 2011-06-15 (×2): 2 via NASAL
  Filled 2011-06-14: qty 16

## 2011-06-14 MED ORDER — ONDANSETRON HCL 4 MG/2ML IJ SOLN
4.0000 mg | Freq: Four times a day (QID) | INTRAMUSCULAR | Status: DC | PRN
  Administered 2011-06-14: 4 mg via INTRAVENOUS
  Filled 2011-06-14: qty 2

## 2011-06-14 MED ORDER — LOSARTAN POTASSIUM-HCTZ 50-12.5 MG PO TABS: 1.0000 | ORAL_TABLET | Freq: Every day | ORAL | Status: DC

## 2011-06-14 MED ORDER — OMEGA-3-ACID ETHYL ESTERS 1 G PO CAPS
1.0000 g | ORAL_CAPSULE | Freq: Two times a day (BID) | ORAL | Status: DC
  Administered 2011-06-14 – 2011-06-17 (×6): 1 g via ORAL
  Filled 2011-06-14 (×9): qty 1

## 2011-06-14 MED ORDER — GADOBENATE DIMEGLUMINE 529 MG/ML IV SOLN
20.0000 mL | Freq: Once | INTRAVENOUS | Status: AC | PRN
  Administered 2011-06-14: 20 mL via INTRAVENOUS

## 2011-06-14 MED ORDER — ACETAMINOPHEN 650 MG RE SUPP: 650.0000 mg | Freq: Four times a day (QID) | RECTAL | Status: DC | PRN

## 2011-06-14 MED ORDER — ACETAMINOPHEN 325 MG PO TABS
650.0000 mg | ORAL_TABLET | Freq: Four times a day (QID) | ORAL | Status: DC | PRN
  Administered 2011-06-14 – 2011-06-17 (×5): 650 mg via ORAL
  Filled 2011-06-14 (×6): qty 2

## 2011-06-14 MED ORDER — HYDROCHLOROTHIAZIDE 12.5 MG PO CAPS
12.5000 mg | ORAL_CAPSULE | Freq: Every day | ORAL | Status: DC
  Administered 2011-06-14 – 2011-06-16 (×3): 12.5 mg via ORAL
  Filled 2011-06-14 (×4): qty 1

## 2011-06-14 MED ORDER — ONDANSETRON HCL 4 MG PO TABS: 4.0000 mg | ORAL_TABLET | Freq: Four times a day (QID) | ORAL | Status: DC | PRN

## 2011-06-14 MED ORDER — GUAIFENESIN ER 600 MG PO TB12
600.0000 mg | ORAL_TABLET | Freq: Two times a day (BID) | ORAL | Status: DC
  Administered 2011-06-14 – 2011-06-17 (×6): 600 mg via ORAL
  Filled 2011-06-14 (×8): qty 1

## 2011-06-14 MED ORDER — SODIUM CHLORIDE 0.9 % IJ SOLN
3.0000 mL | Freq: Two times a day (BID) | INTRAMUSCULAR | Status: DC
  Administered 2011-06-14 – 2011-06-16 (×4): 3 mL via INTRAVENOUS

## 2011-06-14 MED ORDER — LORAZEPAM 0.5 MG PO TABS
0.5000 mg | ORAL_TABLET | Freq: Three times a day (TID) | ORAL | Status: DC
  Administered 2011-06-14 – 2011-06-17 (×9): 0.5 mg via ORAL
  Filled 2011-06-14 (×9): qty 1

## 2011-06-14 NOTE — ED Notes (Signed)
Pt st's she started having congestion approx 6 weeks ago.  Has been to MD and put on antibiotics.  Double vision started 2 weeks ago.  Right eye turned inward 1 week ago and was sent to eye doctor, st's she had some type of nerve damage.  Pt has continued to have headaches.

## 2011-06-14 NOTE — H&P (Signed)
[name] is an 75 y.o. female.   PCP is at [facility] Complaint: Persistent headache. HPI: 75 year-old female with history of hypertension and history of uterine cancer status post hysterectomy last year followed by chemotherapy the last one was this year in April has been experiencing headache for the last 4 weeks and was given antibiotic course for sinusitis. Last the patient developed diplopia and she had gone to [facility] hospital where she had a CAT scan of the head done. As per patient CAT scan head only showed sinusitis. Since then she's been having difficulty to abduct her right eye. She followed with the primary care physician who referred her to ophthalmologist and was told she had one of the nerves paralyzed. She had an appointment with neurologist. But over the last 3 days the headache is worsening so she came to the ER. Patient denies any fevers chills, blurred vision, nausea vomiting, dizziness, any focal deficit or loss of consciousness. She does not have any neck rigidity. ER physician Dr. Judd Lien had called on-call neurologist Dr. Marylou Flesher, who at this time advised hospitalist admission and to get MRI of the brain.  Past Medical History  Diagnosis Date  . Uterine cancer   . Hypertension     Past Surgical History  Procedure Date  . Abdominal hysterectomy     Family History  Problem Relation Age of Onset  . Diabetes type II Mother   . Hypertension Mother    Social History:  reports that she has never smoked. She does not have any smokeless tobacco history on file. She reports that she does not drink alcohol or use illicit drugs.  Allergies:  Allergies  Allergen Reactions  . Amlodipine Besylate-Valsartan     REACTION: myalgias  . Sulfa Antibiotics Swelling  . Sulfonamide Derivatives     Medications Prior to Admission  Medication Dose Route Frequency Provider Last Rate Last Dose  . oxyCODONE-acetaminophen (PERCOCET) 5-325 MG per tablet 2 tablet  2 tablet Oral Once  Geoffery Lyons, MD   2 tablet at 06/14/11 0315   Medications Prior to Admission  Medication Sig Dispense Refill  . acetaminophen (TYLENOL) 500 MG tablet Take 500 mg by mouth every 6 (six) hours as needed. For pain       . aspirin EC 81 MG tablet Take 81 mg by mouth daily.        . fish oil-omega-3 fatty acids 1000 MG capsule Take 1 g by mouth 2 (two) times daily.        . fluticasone (FLONASE) 50 MCG/ACT nasal spray Place 2 sprays into the nose daily.        Marland Kitchen guaiFENesin (MUCINEX) 600 MG 12 hr tablet Take 600 mg by mouth 2 (two) times daily.        Marland Kitchen ibuprofen (ADVIL,MOTRIN) 200 MG tablet Take 100 mg by mouth every 6 (six) hours as needed. For pain       . LORazepam (ATIVAN) 0.5 MG tablet Take 0.5 mg by mouth every 8 (eight) hours. For vertigo       . losartan-hydrochlorothiazide (HYZAAR) 50-12.5 MG per tablet Take 1 tablet by mouth daily.        . Multiple Vitamins-Minerals (MULTIVITAMINS THER. W/MINERALS) TABS Take 1 tablet by mouth daily.        Marland Kitchen OVER THE COUNTER MEDICATION Take 1 tablet by mouth every 4 (four) hours as needed. 4 hour [facility]vs anti-histamine       . simvastatin (ZOCOR) 20 MG tablet Take 20  mg by mouth at bedtime.          Results for orders placed during the hospital encounter of 06/13/11 (from the past 48 hour(s))  CBC     Status: Abnormal   Collection Time   06/14/11  2:22 AM      Component Value Range Comment   WBC 9.1  4.0 - 10.5 (K/uL)    RBC 4.68  3.87 - 5.11 (MIL/uL)    Hemoglobin 14.6  12.0 - 15.0 (g/dL)    HCT 94.8  54.6 - 27.0 (%)    MCV 85.9  78.0 - 100.0 (fL)    MCH 31.2  26.0 - 34.0 (pg)    MCHC 36.3 (*) 30.0 - 36.0 (g/dL)    RDW 35.0  09.3 - 81.8 (%)    Platelets 198  150 - 400 (K/uL)   DIFFERENTIAL     Status: Normal   Collection Time   06/14/11  2:22 AM      Component Value Range Comment   Neutrophils Relative 67  43 - 77 (%)    Neutro Abs 6.1  1.7 - 7.7 (K/uL)    Lymphocytes Relative 21  12 - 46 (%)    Lymphs Abs 2.0  0.7 - 4.0 (K/uL)    Monocytes  Relative 7  3 - 12 (%)    Monocytes Absolute 0.6  0.1 - 1.0 (K/uL)    Eosinophils Relative 4  0 - 5 (%)    Eosinophils Absolute 0.4  0.0 - 0.7 (K/uL)    Basophils Relative 0  0 - 1 (%)    Basophils Absolute 0.0  0.0 - 0.1 (K/uL)   COMPREHENSIVE METABOLIC PANEL     Status: Abnormal   Collection Time   06/14/11  2:22 AM      Component Value Range Comment   Sodium 132 (*) 135 - 145 (mEq/L)    Potassium 3.7  3.5 - 5.1 (mEq/L)    Chloride 95 (*) 96 - 112 (mEq/L)    CO2 26  19 - 32 (mEq/L)    Glucose, Bld 102 (*) 70 - 99 (mg/dL)    BUN 14  6 - 23 (mg/dL)    Creatinine, Ser 2.99  0.50 - 1.10 (mg/dL)    Calcium 8.9  8.4 - 10.5 (mg/dL)    Total Protein 7.1  6.0 - 8.3 (g/dL)    Albumin 3.5  3.5 - 5.2 (g/dL)    AST 18  0 - 37 (U/L)    ALT 14  0 - 35 (U/L)    Alkaline Phosphatase 140 (*) 39 - 117 (U/L)    Total Bilirubin 0.5  0.3 - 1.2 (mg/dL)    GFR calc non Af Amer 68 (*) >90 (mL/min)    GFR calc Af Amer 79 (*) >90 (mL/min)    No results found.  Review of Systems  Constitutional: Negative.   Eyes: Positive for double vision.  Respiratory: Negative.   Cardiovascular: Negative.   Gastrointestinal: Negative.   Genitourinary: Negative.   Musculoskeletal: Negative.   Skin: Negative.   Neurological: Positive for headaches.  Endo/Heme/Allergies: Negative.   Psychiatric/Behavioral: Negative.     Blood pressure 139/59, pulse 75, temperature 97.7 F (36.5 C), temperature source Oral, resp. rate 18, SpO2 99.00%. Physical Exam  Constitutional: She is oriented to person, place, and time. She appears well-developed and well-nourished. No distress.  HENT:  Head: Normocephalic and atraumatic.  Right Ear: External ear normal.  Left Ear: External ear normal.  Nose: Nose normal.  Mouth/Throat: Oropharynx is clear and moist. No oropharyngeal exudate.  Eyes: Conjunctivae are normal. Pupils are equal, round, and reactive to light. Right eye exhibits no discharge. Left eye exhibits no discharge.  No scleral icterus.       UNABLE TO ABDUCT HER RIGHT EYE  Cardiovascular: Normal rate, regular rhythm, normal heart sounds and intact distal pulses.   Respiratory: Effort normal and breath sounds normal. No respiratory distress. She has no wheezes. She has no rales.  GI: Soft. Bowel sounds are normal. She exhibits no distension. There is no tenderness. There is no rebound.  Musculoskeletal: Normal range of motion. She exhibits no edema and no tenderness.  Neurological: She is alert and oriented to person, place, and time. She has normal reflexes. No cranial nerve deficit. Coordination normal.       Moves upper and lower limbs 5/5.  Skin: Skin is warm and dry. No rash noted. She is not diaphoretic. No erythema.  Psychiatric: Her behavior is normal.     Assessment/Plan #1. Headache with difficulty to abduct her right eye. #2. Chronic sinusitis. #3. Uterine cancer status post hysterectomy and chemotherapy, last chemotherapy dose this April. #4. Hypertension. #5. History of hyperlipidemia.  Plan Admit for observation I have discussed with neurologist Dr. Marylou Flesher. Neurologist will be seeing the patient in consult. Since the headache had worsened over last 3 days and getting a stat CT of the head without contrast. MRI of the brain without contrast also has been ordered. Further recommendations based on the tests ordered and neurologist recommendations.  Eduard Clos. 06/14/2011, 5:28 AM

## 2011-06-14 NOTE — Consult Note (Signed)
Reason for Consult:Headache and right eye weakness Referring Physician: Midge Minium  CC: Persistent headache  HPI: [name] is an 75 y.o. female with history of hypertension and uterine cancer status post hysterectomy last year followed by chemotherapy and radiation therapy (last treatment on April 27th) was admitted from the ER for persistent headaches for the last 4 weeks, diplopia for 2 and half weeks and right eye weakness and moving inward for 1 week.  She has had multiple allergies in the past and was treated with 4 different antibiotics over the course of last 4 weeks without much relief. Patient describes headache as throbbing pressure like pain and a feeling as if her "head is like a pressure cooker ready to explode", present all over the head and forehead, worse on right then left. The headache has been persistent, relieved with tylenol and worse with lying straight. Off note she had a rot canal treatment done sometime during this time and only a week after she started developing diplopia. She went to [facility] when she had diplopia. As per patient CT scan head in [place] only showed sinusitis. She was told to follow up with her PCP and during all this time, her diplopia got worse and she noticed that her right eye has started moving inwards. She followed with the primary care physician who referred her to ophthalmologist and was told she had one of the nerves paralyzed. She had an appointment with neurologist. But over the last 4 days the headache was worsening so she came to the ER. Patient denies any fevers, chills, nausea vomiting, dizziness, any focal deficit or loss of consciousness. She does not have any neck rigidity. She does not have difficulty with urination or moving her boewl. She complains of peripheral neuropathy related to her chemo which has been improving lately.   Past Medical History  Diagnosis Date  . Uterine cancer     s/p surgical removal, chemo and radiation    . Hypertension   . Headache   . Peripheral neuropathy     Related to chemotherapy    Past Surgical History  Procedure Date  . Abdominal hysterectomy     with lymphnodes removal  . Heels spurs surgery 1991 and 1992  . Vascular surgery   . Vein ligation and stripping 1976    Family History  Problem Relation Age of Onset  . Diabetes type II Mother   . Hypertension Mother   . Melanoma Brother     died of melanoma related complications  . Parkinsonism Brother     died of complications  . Diabetes type II Daughter     Social History:  reports that she has never smoked. She does not have any smokeless tobacco history on file. She reports that she does not drink alcohol or use illicit drugs.  Allergies  Allergen Reactions  . Amlodipine Besylate-Valsartan     REACTION: myalgias  . Sulfa Antibiotics Swelling  . Sulfonamide Derivatives     Medications: I have reviewed the patient's current medications.  ROS: as per HPI  Blood pressure 134/63, pulse 73, temperature 97.9 F (36.6 C), temperature source Oral, resp. rate 22, height 5\' 3"  (1.6 m), weight 233 lb 0.4 oz (105.7 kg), SpO2 98.00%.  Neurologic Examination:  Mental Status:  Alert, oriented, thought content appropriate. Speech fluent without evidence of aphasia. Able to follow 3 step commands without difficulty.  Cranial Nerves:  II: visual field decreased on the right grossly normal, pupils equal, round, reactive to light  and accommodation  III,IV, VI: ptosis not present, Patient could not move her right eye outwards concerning for 6th nerve palsy on right side V,VII: smile symmetric, facial light touch sensation normal bilaterally  VIII: hearing decreased bilaterally  IX,X: gag reflex present  XI: trapezius strength/neck flexion strength normal bilaterally  XII: tongue deviated to right  Motor:  Right : Upper extremity 5/5 Left: Upper extremity 5/5  Lower extremity 5/5 Lower extremity 5/5  Tone and bulk:normal  tone throughout; no atrophy noted  Sensory: Pinprick and light touch decreased over the right lower extremity  Deep Tendon Reflexes: 1+ and symmetric throughout  Plantars:  Right: downgoing Left: downgoing  Cerebellar:  normal finger-to-nose, normal rapid alternating movements and normal heel-to-shin test   Results for orders placed during the hospital encounter of 06/13/11 (from the past 48 hour(s))  CBC     Status: Abnormal   Collection Time   06/14/11  2:22 AM      Component Value Range Comment   WBC 9.1  4.0 - 10.5 (K/uL)    RBC 4.68  3.87 - 5.11 (MIL/uL)    Hemoglobin 14.6  12.0 - 15.0 (g/dL)    HCT 16.1  09.6 - 04.5 (%)    MCV 85.9  78.0 - 100.0 (fL)    MCH 31.2  26.0 - 34.0 (pg)    MCHC 36.3 (*) 30.0 - 36.0 (g/dL)    RDW 40.9  81.1 - 91.4 (%)    Platelets 198  150 - 400 (K/uL)   DIFFERENTIAL     Status: Normal   Collection Time   06/14/11  2:22 AM      Component Value Range Comment   Neutrophils Relative 67  43 - 77 (%)    Neutro Abs 6.1  1.7 - 7.7 (K/uL)    Lymphocytes Relative 21  12 - 46 (%)    Lymphs Abs 2.0  0.7 - 4.0 (K/uL)    Monocytes Relative 7  3 - 12 (%)    Monocytes Absolute 0.6  0.1 - 1.0 (K/uL)    Eosinophils Relative 4  0 - 5 (%)    Eosinophils Absolute 0.4  0.0 - 0.7 (K/uL)    Basophils Relative 0  0 - 1 (%)    Basophils Absolute 0.0  0.0 - 0.1 (K/uL)   COMPREHENSIVE METABOLIC PANEL     Status: Abnormal   Collection Time   06/14/11  2:22 AM      Component Value Range Comment   Sodium 132 (*) 135 - 145 (mEq/L)    Potassium 3.7  3.5 - 5.1 (mEq/L)    Chloride 95 (*) 96 - 112 (mEq/L)    CO2 26  19 - 32 (mEq/L)    Glucose, Bld 102 (*) 70 - 99 (mg/dL)    BUN 14  6 - 23 (mg/dL)    Creatinine, Ser 7.82  0.50 - 1.10 (mg/dL)    Calcium 8.9  8.4 - 10.5 (mg/dL)    Total Protein 7.1  6.0 - 8.3 (g/dL)    Albumin 3.5  3.5 - 5.2 (g/dL)    AST 18  0 - 37 (U/L)    ALT 14  0 - 35 (U/L)    Alkaline Phosphatase 140 (*) 39 - 117 (U/L)    Total Bilirubin 0.5  0.3 -  1.2 (mg/dL)    GFR calc non Af Amer 68 (*) >90 (mL/min)    GFR calc Af Amer 79 (*) >90 (mL/min)     Ct Head  Wo Contrast  06/14/2011  *RADIOLOGY REPORT*  Clinical Data: Headache and sinus pressure for several days.  CT HEAD WITHOUT CONTRAST  Technique:  Contiguous axial images were obtained from the base of the skull through the vertex without contrast.  Comparison: CT of the head performed 12/18/2008  Findings: There is no evidence of acute infarction, mass lesion, or intra- or extra-axial hemorrhage on CT.  Mild periventricular white matter change likely reflects small vessel ischemic microangiopathy.  The posterior fossa, including the cerebellum, brainstem and fourth ventricle, is within normal limits.  The third and lateral ventricles, and basal ganglia are unremarkable in appearance.  The cerebral hemispheres are symmetric in appearance, with normal gray- white differentiation.  No mass effect or midline shift is seen.  There is no evidence of fracture; visualized osseous structures are unremarkable in appearance.  The visualized portions of the orbits are within normal limits. There is near-complete opacification of the sphenoid sinus with slightly high attenuation fluid; the remaining paranasal sinuses and mastoid air cells are well-aerated.  There is new significant prominence of the adenoids bilaterally, with minimal associated calcification likely reflecting a tonsillolith.  IMPRESSION:  1.  No acute intracranial pathology seen on CT. 2.  Mild small vessel ischemic microangiopathy. 3.  New significant bilateral adenoid prominence, with tiny associated tonsillolith. 4.  Near complete opacification of the sphenoid sinus with slightly high attenuation fluid, just above the prominent adenoids.  This is also new from the prior study.  Original Report Authenticated By: Tonia Ghent, M.D.     Assessment/Plan:  Patient Active Hospital Problem List: Headache (06/14/2011)   Assessment: Persistent  headache for 4 weeks. Differentials are wide and include, stroke, AVM's, unruptured aneurysm, venous thrombosis, CSF disorders, trigeminal neuralgia, sinusitis, TMJ syndrome and temporal arteritis. Infectious etiology is less likely to be related as there is no fever or elevated white cell count. Patient recently had Root canal which makes meningitis/deep rooted abscess a likely etiology.   Plan: ESR, daily wbc's with diff, MRI brain and MRA head and neck. We may need to do an LP if the above are negative. HTN (hypertension) (06/14/2011)   Assessment: Stable   Plan: continue current meds. 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 MS, Vasculitis is less likely. Chemo related damage is common but less likely as she had last treatment several months ago. Will also check HbA1c for Diabetes. Plan: Check Hba1c for diabetes. Also check TSH, ANA, SPEP, UPEP.   Will discuss the case with attending Dr. Lyman Speller.    Lars Mage MD [facility] Resident Pager [phone]  06/14/2011, 10:32 AM

## 2011-06-14 NOTE — Progress Notes (Signed)
Patient admitted this morning. H&P reviewed.  Patient continues to have 8/10 headache on the right side. Double vision for last 2 weeks. Was supposed to be seen by a neurologist this week but pain got worse and she decided to come in to the hospital. Right eye has turned inwards.  History of uterine cancer, s/p surgery and chemotherapy, and Hypertension.  VSS. Obese.  Unable to abduct right eye. No other abnormal examination findings noted. No other focal deficits.  MRI brain is pending. Neurology evaluation is pending as well.

## 2011-06-14 NOTE — ED Notes (Signed)
Pt reports headache is improved and reports no pain currently.

## 2011-06-14 NOTE — ED Provider Notes (Addendum)
History     CSN: [id number] Arrival date & time: 06/13/2011 11:57 PM   First MD Initiated Contact with Patient 06/14/11 0149      Chief Complaint  Patient presents with  . Headache    (Consider location/radiation/quality/duration/timing/severity/associated sxs/prior treatment) HPI Comments: Patient with history of uterine cancer, status post chemo one year ago.  Has been having headaches for the past several months, along with blurry vision and now the right eye won't move.  Headache is worse when she lays back.  CT recently performed and was okay.  Called pcp tonight and was told to come here to see a neurologist and for further workup.    Patient is a 75 y.o. female presenting with headaches.  Headache  This is a chronic problem. The pain is located in the right unilateral region. The quality of the pain is described as throbbing. The pain is severe.    Past Medical History  Diagnosis Date  . Uterine cancer   . Hypertension     Past Surgical History  Procedure Date  . Abdominal hysterectomy     History reviewed. No pertinent family history.  History  Substance Use Topics  . Smoking status: Never Smoker   . Smokeless tobacco: Not on file  . Alcohol Use: No    OB History    Grav Para Term Preterm Abortions TAB SAB Ect Mult Living                  Review of Systems  Neurological: Positive for headaches.  All other systems reviewed and are negative.    Allergies  Amlodipine besylate-valsartan; Sulfa antibiotics; and Sulfonamide derivatives  Home Medications   Current Outpatient Rx  Name Route Sig Dispense Refill  . ACETAMINOPHEN 500 MG PO TABS Oral Take 500 mg by mouth every 6 (six) hours as needed. For pain     . ASPIRIN EC 81 MG PO TBEC Oral Take 81 mg by mouth daily.      . OMEGA-3 FATTY ACIDS 1000 MG PO CAPS Oral Take 1 g by mouth 2 (two) times daily.      Marland Kitchen FLUTICASONE PROPIONATE 50 MCG/ACT NA SUSP Nasal Place 2 sprays into the nose daily.      .  GUAIFENESIN ER 600 MG PO TB12 Oral Take 600 mg by mouth 2 (two) times daily.      . IBUPROFEN 200 MG PO TABS Oral Take 100 mg by mouth every 6 (six) hours as needed. For pain     . LORAZEPAM 0.5 MG PO TABS Oral Take 0.5 mg by mouth every 8 (eight) hours. For vertigo     . LOSARTAN POTASSIUM-HCTZ 50-12.5 MG PO TABS Oral Take 1 tablet by mouth daily.      Carma Leaven M PLUS PO TABS Oral Take 1 tablet by mouth daily.      Marland Kitchen OVER THE COUNTER MEDICATION Oral Take 1 tablet by mouth every 4 (four) hours as needed. 4 hour cvs anti-histamine     . SIMVASTATIN 20 MG PO TABS Oral Take 20 mg by mouth at bedtime.        BP 164/67  Pulse 76  Temp(Src) 98.2 F (36.8 C) (Oral)  Resp 22  SpO2 97%  Physical Exam  Nursing note and vitals reviewed. Constitutional: She is oriented to person, place, and time. She appears well-developed and well-nourished. No distress.  HENT:  Head: Normocephalic and atraumatic.  Neck: Normal range of motion. Neck supple.  Cardiovascular: Normal rate and  regular rhythm.  Exam reveals no gallop and no friction rub.   No murmur heard. Pulmonary/Chest: Effort normal and breath sounds normal. No respiratory distress. She has no wheezes.  Abdominal: Soft. Bowel sounds are normal. She exhibits no distension. There is no tenderness.  Musculoskeletal: Normal range of motion.  Neurological: She is alert and oriented to person, place, and time. A cranial nerve deficit is present.       There is a profound 6th cranial nerve palsy on the right.  Otherwise exam is within normal limits.  Skin: Skin is warm and dry. She is not diaphoretic.    ED Course  Procedures (including critical care time)  Labs Reviewed - No data to display No results found.   No diagnosis found.   Date: 06/14/2011  Rate: 66  Rhythm: nsr  QRS Axis: normal  Intervals: normal  ST/T Wave abnormalities: normal  Conduction Disutrbances:none  Narrative Interpretation:   Old EKG Reviewed: unchanged    MDM    I spoke with Dr. Vickey Huger who recommends admission for further workup and mri.  Labs, EKG okay.  Given two percocet for headache.        Geoffery Lyons, MD 06/14/11 4098  Geoffery Lyons, MD 06/14/11 [phone]

## 2011-06-14 NOTE — ED Notes (Signed)
Family at bedside. VSS; plan of care reviewed.

## 2011-06-14 NOTE — ED Notes (Signed)
Patient is resting comfortably.family at bedside.

## 2011-06-14 NOTE — ED Notes (Signed)
MD at bedside. 

## 2011-06-14 NOTE — ED Notes (Signed)
Patient transported to CT 

## 2011-06-15 ENCOUNTER — Encounter (HOSPITAL_COMMUNITY): Payer: Self-pay | Admitting: *Deleted

## 2011-06-15 ENCOUNTER — Encounter (HOSPITAL_COMMUNITY)
Admission: EM | Disposition: A | Discharge status: Home / Self Care | Payer: Self-pay | Source: Home / Self Care | Attending: Emergency Medicine

## 2011-06-15 DIAGNOSIS — G0491 Myelitis, unspecified: Secondary | ICD-10-CM

## 2011-06-15 DIAGNOSIS — G0490 Encephalitis and encephalomyelitis, unspecified: Secondary | ICD-10-CM

## 2011-06-15 DIAGNOSIS — J329 Chronic sinusitis, unspecified: Secondary | ICD-10-CM

## 2011-06-15 HISTORY — PX: NASAL SINUS SURGERY: SHX719

## 2011-06-15 HISTORY — PX: SPHENOIDECTOMY: SHX2421

## 2011-06-15 LAB — BASIC METABOLIC PANEL
BUN: 13 mg/dL (ref 6–23)
Calcium: 9 mg/dL (ref 8.4–10.5)
Creatinine, Ser: 0.87 mg/dL (ref 0.50–1.10)
GFR calc Af Amer: 75 mL/min — ABNORMAL LOW (ref >90)
GFR calc non Af Amer: 65 mL/min — ABNORMAL LOW (ref >90)
Glucose, Bld: 102 mg/dL — ABNORMAL HIGH (ref 70–99)
Potassium: 3.8 mEq/L (ref 3.5–5.1)

## 2011-06-15 LAB — CBC
HCT: 40.5 % (ref 36.0–46.0)
MCH: 30.2 pg (ref 26.0–34.0)
MCHC: 34.3 g/dL (ref 30.0–36.0)
RDW: 13.3 % (ref 11.5–15.5)

## 2011-06-15 LAB — ANA: Anti Nuclear Antibody(ANA): NEGATIVE

## 2011-06-15 LAB — HEMOGLOBIN A1C: Hgb A1c MFr Bld: 5.8 % — ABNORMAL HIGH (ref <5.7)

## 2011-06-15 LAB — MRSA PCR SCREENING: MRSA by PCR: NEGATIVE

## 2011-06-15 SURGERY — SINUS SURGERY, ENDOSCOPIC
Anesthesia: General | Site: Nose | Wound class: Clean Contaminated

## 2011-06-15 MED ORDER — SODIUM CHLORIDE 0.9 % IJ SOLN
10.0000 mL | INTRAMUSCULAR | Status: DC | PRN
  Filled 2011-06-15: qty 10
  Filled 2011-06-15 (×2): qty 20
  Filled 2011-06-15: qty 10

## 2011-06-15 MED ORDER — IBUPROFEN 400 MG PO TABS
400.0000 mg | ORAL_TABLET | Freq: Once | ORAL | Status: AC
  Administered 2011-06-15: 15:00:00 400 mg via ORAL
  Filled 2011-06-15: qty 1

## 2011-06-15 MED ORDER — SODIUM CHLORIDE 0.9 % IV SOLN
500.0000 mg | Freq: Four times a day (QID) | INTRAVENOUS | Status: DC
  Administered 2011-06-15 – 2011-06-16 (×3): 500 mg via INTRAVENOUS
  Filled 2011-06-15 (×6): qty 500

## 2011-06-15 MED ORDER — MEPERIDINE HCL 50 MG/ML IJ SOLN
25.0000 mg | INTRAMUSCULAR | Status: DC | PRN
  Filled 2011-06-15: qty 1

## 2011-06-15 MED ORDER — SODIUM CHLORIDE 0.9 % IV BOLUS (SEPSIS)
500.0000 mL | INTRAVENOUS | Status: DC
  Administered 2011-06-15: 15:00:00 via INTRAVENOUS

## 2011-06-15 MED ORDER — DEXTROSE 5 % IV SOLN
500.0000 mg | INTRAVENOUS | Status: DC
  Administered 2011-06-15: 500 mg via INTRAVENOUS
  Filled 2011-06-15 (×2): qty 100

## 2011-06-15 MED ORDER — SODIUM CHLORIDE 0.9 % IV SOLN
INTRAVENOUS | Status: DC
  Administered 2011-06-15 – 2011-06-16 (×4): via INTRAVENOUS

## 2011-06-15 MED ORDER — DIPHENHYDRAMINE HCL 50 MG/ML IJ SOLN: 50.0000 mg | Freq: Four times a day (QID) | INTRAMUSCULAR | Status: DC | PRN

## 2011-06-15 MED ORDER — ACETAMINOPHEN 325 MG PO TABS: 650.0000 mg | ORAL_TABLET | Freq: Four times a day (QID) | ORAL | Status: DC | PRN

## 2011-06-15 MED ORDER — VORICONAZOLE 200 MG IV SOLR: 4.0000 mg/kg | Freq: Two times a day (BID) | INTRAVENOUS | Status: DC

## 2011-06-15 MED ORDER — VORICONAZOLE 200 MG IV SOLR
6.0000 mg/kg | Freq: Two times a day (BID) | INTRAVENOUS | Status: DC
  Filled 2011-06-15: qty 630

## 2011-06-15 MED ORDER — VANCOMYCIN HCL 1000 MG IV SOLR
1500.0000 mg | Freq: Two times a day (BID) | INTRAVENOUS | Status: DC
  Administered 2011-06-15: 1500 mg via INTRAVENOUS
  Filled 2011-06-15 (×3): qty 1500

## 2011-06-15 SURGICAL SUPPLY — 40 items
ATTRACTOMAT 16X20 MAGNETIC DRP (DRAPES) IMPLANT
BLADE RAD40 ROTATE 4M 4 5PK (BLADE) IMPLANT
BLADE RAD60 ROTATE M4 4 5PK (BLADE) IMPLANT
BLADE TRICUT ROTATE M4 4 5PK (BLADE) IMPLANT
CANISTER SUCTION 2500CC (MISCELLANEOUS) ×3 IMPLANT
CLEANER TIP ELECTROSURG 2X2 (MISCELLANEOUS) IMPLANT
CLOTH BEACON ORANGE TIMEOUT ST (SAFETY) ×3 IMPLANT
COAGULATOR SUCT 8FR VV (MISCELLANEOUS) IMPLANT
COAGULATOR SUCT SWTCH 10FR 6 (ELECTROSURGICAL) ×1 IMPLANT
DRESSING NASAL KENNEDY 3.5X.9 (MISCELLANEOUS) IMPLANT
DRESSING NASAL POPE 10X1.5X2.5 (GAUZE/BANDAGES/DRESSINGS) IMPLANT
DRESSING TELFA 8X3 (GAUZE/BANDAGES/DRESSINGS) IMPLANT
DRSG NASAL KENNEDY 3.5X.9 (MISCELLANEOUS)
DRSG NASAL POPE 10X1.5X2.5 (GAUZE/BANDAGES/DRESSINGS) ×3
ELECT COATED BLADE 2.86 ST (ELECTRODE) IMPLANT
ELECT REM PT RETURN 9FT ADLT (ELECTROSURGICAL)
ELECTRODE REM PT RTRN 9FT ADLT (ELECTROSURGICAL) IMPLANT
FILTER ARTHROSCOPY CONVERTOR (FILTER) IMPLANT
GLOVE SS BIOGEL STRL SZ 7.5 (GLOVE) ×4 IMPLANT
GLOVE SUPERSENSE BIOGEL SZ 7.5 (GLOVE) ×2
GOWN PREVENTION PLUS XLARGE (GOWN DISPOSABLE) ×3 IMPLANT
GOWN STRL NON-REIN LRG LVL3 (GOWN DISPOSABLE) ×6 IMPLANT
KIT BASIN OR (CUSTOM PROCEDURE TRAY) ×3 IMPLANT
KIT ROOM TURNOVER OR (KITS) ×3 IMPLANT
MARKER SPHERE PSV REFLC NDI (MISCELLANEOUS) ×15 IMPLANT
NDL SPNL 25GX3.5 QUINCKE BL (NEEDLE) IMPLANT
NEEDLE 27GAX1X1/2 (NEEDLE) ×3 IMPLANT
NEEDLE SPNL 25GX3.5 QUINCKE BL (NEEDLE) IMPLANT
NS IRRIG 1000ML POUR BTL (IV SOLUTION) ×3 IMPLANT
PAD ARMBOARD 7.5X6 YLW CONV (MISCELLANEOUS) ×6 IMPLANT
PATTIES SURGICAL .5 X3 (DISPOSABLE) ×3 IMPLANT
PENCIL BUTTON HOLSTER BLD 10FT (ELECTRODE) IMPLANT
SOLUTION ANTI FOG 6CC (MISCELLANEOUS) ×3 IMPLANT
SPECIMEN JAR SMALL (MISCELLANEOUS) ×6 IMPLANT
SPLINT NASAL DOYLE BI-VL (GAUZE/BANDAGES/DRESSINGS) IMPLANT
TOWEL OR 17X24 6PK STRL BLUE (TOWEL DISPOSABLE) ×3 IMPLANT
TOWEL OR 17X26 10 PK STRL BLUE (TOWEL DISPOSABLE) ×3 IMPLANT
TRAY ENT MC OR (CUSTOM PROCEDURE TRAY) ×3 IMPLANT
TUBE CONNECTING 12X1/4 (SUCTIONS) ×3 IMPLANT
WATER STERILE IRR 1000ML POUR (IV SOLUTION) ×3 IMPLANT

## 2011-06-15 NOTE — Consult Note (Signed)
Subjective: Patient complains of right occipital headache, but double vision has improved.   Objective: Vital signs in last 24 hours: Temp:  [97.2 F (36.2 C)-98 F (36.7 C)] 97.2 F (36.2 C) (12/07 0830) Pulse Rate:  [67-84] 77  (12/07 0830) Resp:  [16-26] 26  (12/07 0830) BP: (120-136)/(50-103) 120/103 mmHg (12/07 0830) SpO2:  [94 %-99 %] 96 % (12/07 0830)  Intake/Output from previous day: 12/06 0701 - 12/07 0700 In: 560 [P.O.:560] Out: 3 [Urine:1; Stool:2]   Nutritional status: NPO  Neurological exam: AAO*3. No aphasia.  Was able to tell me months of the year forwards and backwards correctly, exhibiting good attention span. Recall was 3 of 3 after 5 minutes. Followed complex commands. Cranial nerves: right horizontal gaze palsy, PERRL. Visual fields were full. Sensation to V1 through V3 areas of the face was intact and symmetric throughout. There was no facial asymmetry. Hearing to finger rub was equal and symmetrical bilaterally. Shoulder shrug was 5/5 and symmetric bilaterally. Head rotation was 5/5 bilaterally. There was no dysarthria or palatal deviation. Motor: strength was 5/5 and symmetric throughout. Sensory: was intact throughout to light touch, pinprick, vibration and proprioception. Coordination: finger-to-nose and heel-to-shin were intact and symmetric bilaterally. Reflexes: were 2+ in upper extremities and 1+ at the knees and 1+ at the ankles. Plantar response was downgoing bilaterally. Gait: Romberg test was negative. There was no ataxia noted.  Lab Results:  Basename 06/15/11 0600 06/14/11 0222  WBC 8.6 9.1  HGB 13.9 14.6  HCT 40.5 40.2  PLT 198 198  NA 134* 132*  K 3.8 3.7  CL 97 95*  CO2 28 26  GLUCOSE 102* 102*  BUN 13 14  CREATININE 0.87 0.83  CALCIUM 9.0 8.9  LABA1C -- --   Studies/Results: Ct Head Wo Contrast  06/14/2011  *RADIOLOGY REPORT*  Clinical Data: Headache and sinus pressure for several days.  CT HEAD WITHOUT CONTRAST  Technique:  Contiguous  axial images were obtained from the base of the skull through the vertex without contrast.  Comparison: CT of the head performed 12/18/2008  Findings: There is no evidence of acute infarction, mass lesion, or intra- or extra-axial hemorrhage on CT.  Mild periventricular white matter change likely reflects small vessel ischemic microangiopathy.  The posterior fossa, including the cerebellum, brainstem and fourth ventricle, is within normal limits.  The third and lateral ventricles, and basal ganglia are unremarkable in appearance.  The cerebral hemispheres are symmetric in appearance, with normal gray- white differentiation.  No mass effect or midline shift is seen.  There is no evidence of fracture; visualized osseous structures are unremarkable in appearance.  The visualized portions of the orbits are within normal limits. There is near-complete opacification of the sphenoid sinus with slightly high attenuation fluid; the remaining paranasal sinuses and mastoid air cells are well-aerated.  There is new significant prominence of the adenoids bilaterally, with minimal associated calcification likely reflecting a tonsillolith.  IMPRESSION:  1.  No acute intracranial pathology seen on CT. 2.  Mild small vessel ischemic microangiopathy. 3.  New significant bilateral adenoid prominence, with tiny associated tonsillolith. 4.  Near complete opacification of the sphenoid sinus with slightly high attenuation fluid, just above the prominent adenoids.  This is also new from the prior study.  Original Report Authenticated By: Tonia Ghent, M.D.   Mr Angiogram Neck W Wo Contrast  06/15/2011   *RADIOLOGY REPORT*  Clinical Data:  Six nerve palsy right side.  Headache.  Sinus infection treated with antibiotics.  MRI  HEAD WITHOUT AND WITH CONTRAST MRA HEAD WITHOUT CONTRAST MRA NECK WITHOUT AND WITH CONTRAST  Technique:  Multiplanar, multiecho pulse sequences of the brain and surrounding structures were obtained without and with  intravenous contrast.  Angiographic images of the Circle of Willis were obtained using MRA technique without intravenous contrast. Angiographic images of the neck were obtained using MRA technique without and with intravenous contrast.  Carotid stenosis measurements (when applicable) are obtained utilizing NASCET criteria, using the distal internal carotid diameter as the denominator.  Contrast: 20mL MULTIHANCE GADOBENATE DIMEGLUMINE 529 MG/ML IV SOLN  Comparison:  CT head 06/14/2011  MRI HEAD  Findings:  Complete opacification of the sphenoid sinus bilaterally.  There is thickening of the dura posterior to the clivus.  The dura is thickened and shows diffuse solid enhancement in this area.  Findings are compatible with sinusitis extending through the clivus to the dura.  No epidural abscess is identified. 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  Left cavernous sinus appears normal.  Mild mucosal edema in the ethmoid and maxillary sinuses bilaterally.  Mild mucosal edema in the mastoid sinuses bilaterally.  Abnormal signal in the left medial occipital lobe with gyriform cortical enhancement and surrounding white matter edema.  This area shows minimal restricted diffusion.  I would favor this represents cerebritis rather than infarction.  No other areas of abnormal enhancement in the brain.  2 mm restricted diffusion in the left parietal lobe may represent infarct or an early area of cerebritis.  IMPRESSION: Sphenoid sinusitis with extension through the clivus into the dura. No epidural abscess.  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  Gyriform enhancement left occipital lobe most likely related to cerebritis from infection.  Small focus of restricted diffusion left parietal lobe may represent infarct or early cerebritis.   Consider fungal sinusitis.  MRA HEAD  Findings: Both vertebral arteries are patent to the basilar.  PICA is patent bilaterally.  Superior cerebellar and posterior cerebral arteries are patent bilaterally.  Patent posterior communicating artery bilaterally.  Internal carotid artery is patent bilaterally.  No significant stenosis in the cavernous segment.  Anterior and middle cerebral arteries are patent bilaterally.  Negative for cerebral aneurysm.  IMPRESSION: Negative  MRA NEC  Findings: Both carotid arteries are widely patent without significant stenosis.  Both vertebral arteries are patent to the basilar without significant stenosis.  Left vertebral artery is dominant.  IMPRESSION: Negative  Critical Value/emergent results were called by telephone at the time of interpretation on 06/15/2011  at 0900 hours  to  Dr. Rito Ehrlich, who verbally acknowledged these results.  Original Report Authenticated By: Camelia Phenes, M.D.   Medications: I have reviewed the patient's current medications.  Assessment/Plan: 75 years old woman with right CN6 palsy in setting of sinusitis and thrombophlebitis 1) According to patient, will go for surgery today 2) Management of sinusitis per medicine/surgery 3) Call with questions  LOS: 2 days   Townes Fuhs

## 2011-06-15 NOTE — Preoperative (Addendum)
Beta Blockers   Reason not to administer Beta Blockers:Not Applicable 

## 2011-06-15 NOTE — Progress Notes (Signed)
PT TRANSFERRED TO OR.  FAMILY MEMBERS HAVE PT'S BELONGINGS AND PT'S PARTIAL PLATE.

## 2011-06-15 NOTE — Consult Note (Signed)
Date of Admission:  06/13/2011  Date of Consult:  06/15/2011  Reason for Consult:invasive sinus infection with extension into brain and cerebritis Referring Physician:Dr. Rito Ehrlich   HPI: [name] is an 75 y.o. female. with  uterine cancer status post hysterectomy last year followed by chemotherapy the last one was this year in April has been experiencing headache for the last 4 weeks and had been unsuccessfully rx with oral abx including azithromycin, then amoxicillin who then two weeks ago developed diplopia, and now has a 6th nerve palsy on the right. Her PCP had referred her to opthomology and neurology but patient had not been seen. She had worsening headache posteriorly and came to ED where imaging with CT and now MRI shows: Complete opacification of the sphenoid sinus  Bilaterally with thickening of the dura and enhancement c/w sinusitis extending through the clivus to the dura. No epidural abscess is identified.  And evidence of cavernous sinus thrombophlebitis on the right. There is also Abnormal signal in the left medial occipital lobe with gyriform  cortical enhancement and surrounding this. She has been admitted to [facility] and Dr. Ezzard Standing from ENT has been consulted. On exam the patient has severe headache but not requesting pain meds. She has obvious 6th nerve palsy. I am starting her on amphoteriicn lipid for possible mucor, plus vancomycin and imipenem for broad spectrum antimicrobial antibiotics. I will make sure she has blood cultures done prior to starting antibiotics. I spent greater than 60 minutes with the patient including greater than 50% of time in face to face counsel of the patient and in coordination of their care.     Past Medical History  Diagnosis Date  . Uterine cancer     s/p surgical removal, chemo and radiation  . Hypertension   . Headache   . Peripheral neuropathy     Related to chemotherapy    Past Surgical History  Procedure Date  . Abdominal  hysterectomy     with lymphnodes removal  . Heels spurs surgery 1991 and 1992  . Vascular surgery   . Vein ligation and stripping 1976  ergies:   Allergies  Allergen Reactions  . Amlodipine Besylate-Valsartan     REACTION: myalgias  . Sulfa Antibiotics Swelling  . Sulfonamide Derivatives      Medications: I have reviewed patients current medications as documented in Epic Anti-infectives     Start     Dose/Rate Route Frequency Ordered Stop   06/16/11 1200   voriconazole (VFEND) 420 mg in sodium chloride 0.9 % 150 mL IVPB  Status:  Discontinued        4 mg/kg  105.7 kg 144 mL/hr over 80 Minutes Intravenous Every 12 hours 06/15/11 1035 06/15/11 1114   06/15/11 1300   vancomycin (VANCOCIN) 1,500 mg in sodium chloride 0.9 % 500 mL IVPB        1,500 mg 250 mL/hr over 120 Minutes Intravenous Every 12 hours 06/15/11 1035     06/15/11 1300   amphotericin B lipid (ABELCET) 500 mg in dextrose 5 % 250 mL IVPB     Comments: Rounded from 5mg /kg per protocol.       500 mg 175 mL/hr over 120 Minutes Intravenous Every 24 hours 06/15/11 1114     06/15/11 1200   voriconazole (VFEND) 630 mg in sodium chloride 0.9 % 150 mL IVPB  Status:  Discontinued        6 mg/kg  105.7 kg 106.5 mL/hr over 120 Minutes Intravenous Every 12 hours  06/15/11 1035 06/15/11 1114   06/15/11 1200  imipenem-cilastatin (PRIMAXIN) 500 mg in sodium chloride 0.9 % 100 mL IVPB       500 mg 200 mL/hr over 30 Minutes Intravenous 4 times per day 06/15/11 1035            Social History:  reports that she has never smoked. She does not have any smokeless tobacco history on file. She reports that she does not drink alcohol or use illicit drugs.  Family History  Problem Relation Age of Onset  . Diabetes type II Mother   . Hypertension Mother   . Melanoma Brother     died of melanoma related complications  . Parkinsonism Brother     died of complications  . Diabetes type II Daughter     As in HPI and primary teams  notes otherwise 12 point review of systems is negative  Blood pressure 120/103, pulse 77, temperature 97.2 F (36.2 C), temperature source Oral, resp. rate 26, height 5\' 3"  (1.6 m), weight 233 lb 0.4 oz (105.7 kg), SpO2 96.00%. General: Alert and awake, oriented x3, not in any acute distress. HEENT: 6th nerve palsy cannot abduct right eye at all. Pupils otherwise reactive, no icterus  CVS regular rate, normal r,  no murmur rubs or gallops Chest: clear to auscultation bilaterally, no wheezing, rales or rhonchi Abdomen: soft nontender, nondistended, normal bowel sounds, Extremities: no  clubbing or edema noted bilaterally Skin: no rashes, portacath clean Neuro: 6th nerve palsy, otherwise  No other obvious CN abnormalities, strength and sensation intact   Results for orders placed during the hospital encounter of 06/13/11 (from the past 48 hour(s))  CBC     Status: Abnormal   Collection Time   06/14/11  2:22 AM      Component Value Range Comment   WBC 9.1  4.0 - 10.5 (K/uL)    RBC 4.68  3.87 - 5.11 (MIL/uL)    Hemoglobin 14.6  12.0 - 15.0 (g/dL)    HCT 04.5  40.9 - 81.1 (%)    MCV 85.9  78.0 - 100.0 (fL)    MCH 31.2  26.0 - 34.0 (pg)    MCHC 36.3 (*) 30.0 - 36.0 (g/dL)    RDW 91.4  78.2 - 95.6 (%)    Platelets 198  150 - 400 (K/uL)   DIFFERENTIAL     Status: Normal   Collection Time   06/14/11  2:22 AM      Component Value Range Comment   Neutrophils Relative 67  43 - 77 (%)    Neutro Abs 6.1  1.7 - 7.7 (K/uL)    Lymphocytes Relative 21  12 - 46 (%)    Lymphs Abs 2.0  0.7 - 4.0 (K/uL)    Monocytes Relative 7  3 - 12 (%)    Monocytes Absolute 0.6  0.1 - 1.0 (K/uL)    Eosinophils Relative 4  0 - 5 (%)    Eosinophils Absolute 0.4  0.0 - 0.7 (K/uL)    Basophils Relative 0  0 - 1 (%)    Basophils Absolute 0.0  0.0 - 0.1 (K/uL)   COMPREHENSIVE METABOLIC PANEL     Status: Abnormal   Collection Time   06/14/11  2:22 AM      Component Value Range Comment   Sodium 132 (*) 135 - 145  (mEq/L)    Potassium 3.7  3.5 - 5.1 (mEq/L)    Chloride 95 (*) 96 - 112 (mEq/L)  CO2 26  19 - 32 (mEq/L)    Glucose, Bld 102 (*) 70 - 99 (mg/dL)    BUN 14  6 - 23 (mg/dL)    Creatinine, Ser 1.61  0.50 - 1.10 (mg/dL)    Calcium 8.9  8.4 - 10.5 (mg/dL)    Total Protein 7.1  6.0 - 8.3 (g/dL)    Albumin 3.5  3.5 - 5.2 (g/dL)    AST 18  0 - 37 (U/L)    ALT 14  0 - 35 (U/L)    Alkaline Phosphatase 140 (*) 39 - 117 (U/L)    Total Bilirubin 0.5  0.3 - 1.2 (mg/dL)    GFR calc non Af Amer 68 (*) >90 (mL/min)    GFR calc Af Amer 79 (*) >90 (mL/min)   TSH     Status: Normal   Collection Time   06/14/11  2:22 AM      Component Value Range Comment   TSH 2.294  0.350 - 4.500 (uIU/mL)   SEDIMENTATION RATE     Status: Normal   Collection Time   06/14/11  3:32 PM      Component Value Range Comment   Sed Rate 20  0 - 22 (mm/hr)   HEMOGLOBIN A1C     Status: Abnormal   Collection Time   06/14/11  3:32 PM      Component Value Range Comment   Hemoglobin A1C 5.8 (*) <5.7 (%)    Mean Plasma Glucose 120 (*) <117 (mg/dL)   TSH     Status: Normal   Collection Time   06/14/11  3:32 PM      Component Value Range Comment   TSH 0.852  0.350 - 4.500 (uIU/mL)   ANA     Status: Normal   Collection Time   06/14/11  3:32 PM      Component Value Range Comment   ANA NEGATIVE  NEGATIVE    CBC     Status: Normal   Collection Time   06/15/11  6:00 AM      Component Value Range Comment   WBC 8.6  4.0 - 10.5 (K/uL)    RBC 4.60  3.87 - 5.11 (MIL/uL)    Hemoglobin 13.9  12.0 - 15.0 (g/dL)    HCT 09.6  04.5 - 40.9 (%)    MCV 88.0  78.0 - 100.0 (fL)    MCH 30.2  26.0 - 34.0 (pg)    MCHC 34.3  30.0 - 36.0 (g/dL)    RDW 81.1  91.4 - 78.2 (%)    Platelets 198  150 - 400 (K/uL)   BASIC METABOLIC PANEL     Status: Abnormal   Collection Time   06/15/11  6:00 AM      Component Value Range Comment   Sodium 134 (*) 135 - 145 (mEq/L)    Potassium 3.8  3.5 - 5.1 (mEq/L)    Chloride 97  96 - 112 (mEq/L)    CO2 28  19 -  32 (mEq/L)    Glucose, Bld 102 (*) 70 - 99 (mg/dL)    BUN 13  6 - 23 (mg/dL)    Creatinine, Ser 9.56  0.50 - 1.10 (mg/dL)    Calcium 9.0  8.4 - 10.5 (mg/dL)    GFR calc non Af Amer 65 (*) >90 (mL/min)    GFR calc Af Amer 75 (*) >90 (mL/min)       Component Value Date/Time   SDES URINE, CLEAN CATCH 04/17/2009 1026  SPECREQUEST NONE 04/17/2009 1026   CULT Multiple bacterial morphotypes present, none predominant. Suggest appropriate recollection if clinically indicated. 04/17/2009 1026   REPTSTATUS 04/20/2009 FINAL 04/17/2009 1026   Ct Head Wo Contrast  06/14/2011  *RADIOLOGY REPORT*  Clinical Data: Headache and sinus pressure for several days.  CT HEAD WITHOUT CONTRAST  Technique:  Contiguous axial images were obtained from the base of the skull through the vertex without contrast.  Comparison: CT of the head performed 12/18/2008  Findings: There is no evidence of acute infarction, mass lesion, or intra- or extra-axial hemorrhage on CT.  Mild periventricular white matter change likely reflects small vessel ischemic microangiopathy.  The posterior fossa, including the cerebellum, brainstem and fourth ventricle, is within normal limits.  The third and lateral ventricles, and basal ganglia are unremarkable in appearance.  The cerebral hemispheres are symmetric in appearance, with normal gray- white differentiation.  No mass effect or midline shift is seen.  There is no evidence of fracture; visualized osseous structures are unremarkable in appearance.  The visualized portions of the orbits are within normal limits. There is near-complete opacification of the sphenoid sinus with slightly high attenuation fluid; the remaining paranasal sinuses and mastoid air cells are well-aerated.  There is new significant prominence of the adenoids bilaterally, with minimal associated calcification likely reflecting a tonsillolith.  IMPRESSION:  1.  No acute intracranial pathology seen on CT. 2.  Mild small vessel  ischemic microangiopathy. 3.  New significant bilateral adenoid prominence, with tiny associated tonsillolith. 4.  Near complete opacification of the sphenoid sinus with slightly high attenuation fluid, just above the prominent adenoids.  This is also new from the prior study.  Original Report Authenticated By: Tonia Ghent, M.D.   Mr Angiogram Neck W Wo Contrast  06/15/2011   *RADIOLOGY REPORT*  Clinical Data:  Six nerve palsy right side.  Headache.  Sinus infection treated with antibiotics.  MRI HEAD WITHOUT AND WITH CONTRAST MRA HEAD WITHOUT CONTRAST MRA NECK WITHOUT AND WITH CONTRAST  Technique:  Multiplanar, multiecho pulse sequences of the brain and surrounding structures were obtained without and with intravenous contrast.  Angiographic images of the Circle of Willis were obtained using MRA technique without intravenous contrast. Angiographic images of the neck were obtained using MRA technique without and with intravenous contrast.  Carotid stenosis measurements (when applicable) are obtained utilizing NASCET criteria, using the distal internal carotid diameter as the denominator.  Contrast: 20mL MULTIHANCE GADOBENATE DIMEGLUMINE 529 MG/ML IV SOLN  Comparison:  CT head 06/14/2011  MRI HEAD  Findings:  Complete opacification of the sphenoid sinus bilaterally.  There is thickening of the dura posterior to the clivus.  The dura is thickened and shows diffuse solid enhancement in this area.  Findings are compatible with sinusitis extending through the clivus to the dura.  No epidural abscess is identified. 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  Left cavernous sinus appears normal.  Mild mucosal edema in the ethmoid and maxillary sinuses bilaterally.  Mild mucosal edema in the mastoid sinuses bilaterally.  Abnormal signal in the left medial occipital lobe with gyriform cortical enhancement and  surrounding white matter edema.  This area shows minimal restricted diffusion.  I would favor this represents cerebritis rather than infarction.  No other areas of abnormal enhancement in the brain.  2 mm restricted diffusion in the left parietal lobe may represent infarct or an early area of cerebritis.  IMPRESSION: Sphenoid  sinusitis with extension through the clivus into the dura. No epidural abscess.  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  Gyriform enhancement left occipital lobe most likely related to cerebritis from infection.  Small focus of restricted diffusion left parietal lobe may represent infarct or early cerebritis.  Consider fungal sinusitis.  MRA HEAD  Findings: Both vertebral arteries are patent to the basilar.  PICA is patent bilaterally.  Superior cerebellar and posterior cerebral arteries are patent bilaterally.  Patent posterior communicating artery bilaterally.  Internal carotid artery is patent bilaterally.  No significant stenosis in the cavernous segment.  Anterior and middle cerebral arteries are patent bilaterally.  Negative for cerebral aneurysm.  IMPRESSION: Negative  MRA NEC  Findings: Both carotid arteries are widely patent without significant stenosis.  Both vertebral arteries are patent to the basilar without significant stenosis.  Left vertebral artery is dominant.  IMPRESSION: Negative  Critical Value/emergent results were called by telephone at the time of interpretation on 06/15/2011  at 0900 hours  to  Dr. Rito Ehrlich, who verbally acknowledged these results.  Original Report Authenticated By: Camelia Phenes, M.D.     No results found for this or any previous visit (from the past 720 hour(s)).   Impression/Recommendation 75 yo with hx of uterine cancer, chemotherapy with portacath now with severe sinusitis with extension into the brain, also with occipital area concerning for  cerebritis.  Sinusitis with extension into brain, and also with occipital cerebritis that is not adjacent to this. This is VERY concerning for aggressive fungal infections such as mucormycosis. This type of infection REQUIRES aggressive surgical debridement as soon as possible along with antifungal antibiotics. I am very bothered by the posterior cerebritis which is not contiguous with this infection and raises question of bloodstream infection. Patient has been using nasal steriods but not on recent immunosuppressive abx. Bacterial pathogens are also possible including acitnomyces  --I am starting amphotericin lipid 5mg /kg with pre and post boluses of 500 NS and prn demerol, benadryl and tylenol --will need daily cbc, bmp, mag checked and repleted  --I am starting imipenem for broad spectrum antibacterial coverage including for actinomyces and vancomycin for MRSA coverage --I will get blood cultures x 2 sites now and fungal blood culture and have asked for abx to start immediately afterwards --I am ordering picc for now, we may also need to take out her port since there is concern for bloodstream infection here as well with the posterior cerebritis  --We are going to need to reimage her in a few days to examine the area in occiput and certainly if there is any possibility that this is mucor infection then this will Also REQUIRE surgery and we would then need Neurosurgery involvment for craniotomy  --she will need echo to look for endocarditi and also assess ef, she is going to get lots of IVF to protect her kidneys with amphotericin flowing  I have discussed thoroughly with [name], radiology, [facility] team and Dr. Ezzard Standing who is taking pt to OR this afternoon for extensive debridement.  Dr. Cathie Olden will be covering this weekend   Thank you so much for this interesting consult,   [name] 06/15/2011, 11:28 AM   [phone] (pager) [phone] (office)

## 2011-06-15 NOTE — Consults (Signed)
[name] is a 75 year old female. She has had headaches for about a month now has been treated for sinusitis with amoxicillin and zpak x 2. The she presented to the hospital with I nerve palsy and worsening headache. 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 She had a previous history of endometrial cancer status post radiation and chemotherapy completed in April of this year.     On examination the patient is alert and oriented he has complaints of headache  symptoms. She is not ill-appearing. She has no drainage from her nose and no or minimal nasal symptoms.      I will plan on taking her to the operating room this evening for endoscopic sinus surgery and drainage of the sphenoid sinuses. I would recommend starting her on IV antibiotics.

## 2011-06-15 NOTE — Progress Notes (Signed)
ANTIBIOTIC CONSULT NOTE - INITIAL  Pharmacy Consult for Vancomycin / Primaxin / Voriconazole Indication: Headache / Sinusitis / Empiric coverage  Allergies  Allergen Reactions  . Amlodipine Besylate-Valsartan     REACTION: myalgias  . Sulfa Antibiotics Swelling  . Sulfonamide Derivatives     Patient Measurements: Height: 5\' 3"  (160 cm) Weight: 233 lb 0.4 oz (105.7 kg) IBW/kg (Calculated) : 52.4    Vital Signs: Temp: 97.2 F (36.2 C) (12/07 0830) Temp src: Oral (12/07 0830) BP: 120/103 mmHg (12/07 0830) Pulse Rate: 77  (12/07 0830) Intake/Output from previous day: 12/06 0701 - 12/07 0700 In: 560 [P.O.:560] Out: 3 [Urine:1; Stool:2] Intake/Output from this shift:    Labs:  Basename 06/15/11 0600 06/14/11 0222  WBC 8.6 9.1  HGB 13.9 14.6  PLT 198 198  LABCREA -- --  CREATININE 0.87 0.83   Estimated Creatinine Clearance: 67 ml/min (by C-G formula based on Cr of 0.87). No results found for this basename: VANCOTROUGH:2,VANCOPEAK:2,VANCORANDOM:2,GENTTROUGH:2,GENTPEAK:2,GENTRANDOM:2,TOBRATROUGH:2,TOBRAPEAK:2,TOBRARND:2,AMIKACINPEAK:2,AMIKACINTROU:2,AMIKACIN:2, in the last 72 hours   Microbiology: No results found for this or any previous visit (from the past 720 hour(s)).  Medical History: Past Medical History  Diagnosis Date  . Uterine cancer     s/p surgical removal, chemo and radiation  . Hypertension   . Headache   . Peripheral neuropathy     Related to chemotherapy    Medications:  Scheduled:    . fluticasone  2 spray Each Nare Daily  . guaiFENesin  600 mg Oral BID  . hydrochlorothiazide  12.5 mg Oral Daily  . ibuprofen  400 mg Oral Once  . imipenem-cilastatin  500 mg Intravenous Q6H  . LORazepam  0.5 mg Oral Q8H  . losartan  50 mg Oral Daily  . multivitamins ther. w/minerals  1 tablet Oral Daily  . omega-3 acid ethyl esters  1 g Oral BID  . sodium chloride  3 mL Intravenous Q12H  . vancomycin  1,500 mg Intravenous Q12H  . voriconazole (VFEND - PT  WT > 85 kg) IV  6 mg/kg Intravenous Q12H   Followed by  . voriconazole (VFEND - PT WT > 85 kg) IV  4 mg/kg Intravenous Q12H  . DISCONTD: simvastatin  20 mg Oral QHS   Assessment:  75 year old admitted with headache and sinusitis, Wt. = 105 kg, CrCl stable, Beginning empiric antibiotics  Goal of Therapy:  1) Vancomycin trough = 15 to 20 mcg/dl 2) appropriate Primaxin and Voriconazole dosing  Plan:  1) Vancomycin 1500 mg IV q 12 hours 2) Primaxin 500 mg IV q 6 hours 3) Voriconazole 6 mg/kg IV q12 x 2 doses then 4 mg/kg IV q12  4) Have held Zocor while on Voriconazole due to drug-drug interaction 5) Will to follow plan, cultures, and renal function.  Thank you.  Elwin Sleight 06/15/2011,10:36 AM

## 2011-06-15 NOTE — Progress Notes (Addendum)
PCP: At [place], [place]  Brief narrative:  75 year-old female with history of hypertension and history of uterine cancer status post hysterectomy last year followed by chemotherapy, the last one was this year in April, has been experiencing headache for the last 4 weeks and was given antibiotic course for sinusitis. Then the patient developed diplopia and she had gone to [facility] hospital where she had a CAT scan of the head done. As per patient CAT scan head only showed sinusitis. Since then she's been having difficulty to abduct her right eye. She followed with the primary care physician who referred her to ophthalmologist and was told she had one of the nerves paralyzed. She had an appointment with neurologist. But the headache got worse so she came to the ER. Patient denies any fevers chills, blurred vision, nausea vomiting, dizziness, any focal deficit or loss of consciousness. She does not have any neck rigidity  Past medical history: Uterine Cancer and Hypertension  Consultants: Neurology  Procedures: None  Subjective: Patient felt better yesterday evening. Has 5/10 headache this morning. No nausea. No other complaints offered.  Objective: Vital signs in last 24 hours: Temp:  [97.4 F (36.3 C)-98 F (36.7 C)] 97.4 F (36.3 C) (12/07 0500) Pulse Rate:  [67-85] 71  (12/07 0500) Resp:  [15-22] 18  (12/07 0500) BP: (123-162)/(50-74) 125/50 mmHg (12/07 0500) SpO2:  [94 %-100 %] 96 % (12/07 0500) Weight change:  Last BM Date: 06/13/11  Intake/Output from previous day: 12/06 0701 - 12/07 0700 In: 560 [P.O.:560] Out: 3 [Urine:1; Stool:2] Intake/Output this shift:    General appearance: alert, cooperative and no distress Head: Normocephalic, without obvious abnormality, atraumatic Eyes: negative findings: lids and lashes normal, conjunctivae and sclerae normal and corneas clear, Right eye does not abduct. Throat: lips, mucosa, and tongue normal; teeth and gums normal Neck: no  adenopathy, no carotid bruit, no JVD, supple, symmetrical, trachea midline and thyroid not enlarged, symmetric, no tenderness/mass/nodules Resp: clear to auscultation bilaterally Cardio: regular rate and rhythm, S1, S2 normal, no murmur, click, rub or gallop GI: soft, non-tender; bowel sounds normal; no masses,  no organomegaly Extremities: extremities normal, atraumatic, no cyanosis or edema Pulses: 2+ and symmetric Skin: Skin color, texture, turgor normal. No rashes or lesions Neurologic: Grossly normal  Lab Results:  Basename 06/15/11 0600 06/14/11 0222  WBC 8.6 9.1  HGB 13.9 14.6  HCT 40.5 40.2  PLT 198 198   BMET  Basename 06/15/11 0600 06/14/11 0222  NA 134* 132*  K 3.8 3.7  CL 97 95*  CO2 28 26  GLUCOSE 102* 102*  BUN 13 14  CREATININE 0.87 0.83  CALCIUM 9.0 8.9    Studies/Results: Ct Head Wo Contrast  06/14/2011  *RADIOLOGY REPORT*  Clinical Data: Headache and sinus pressure for several days.  CT HEAD WITHOUT CONTRAST  Technique:  Contiguous axial images were obtained from the base of the skull through the vertex without contrast.  Comparison: CT of the head performed 12/18/2008  Findings: There is no evidence of acute infarction, mass lesion, or intra- or extra-axial hemorrhage on CT.  Mild periventricular white matter change likely reflects small vessel ischemic microangiopathy.  The posterior fossa, including the cerebellum, brainstem and fourth ventricle, is within normal limits.  The third and lateral ventricles, and basal ganglia are unremarkable in appearance.  The cerebral hemispheres are symmetric in appearance, with normal gray- white differentiation.  No mass effect or midline shift is seen.  There is no evidence of fracture; visualized osseous  structures are unremarkable in appearance.  The visualized portions of the orbits are within normal limits. There is near-complete opacification of the sphenoid sinus with slightly high attenuation fluid; the remaining  paranasal sinuses and mastoid air cells are well-aerated.  There is new significant prominence of the adenoids bilaterally, with minimal associated calcification likely reflecting a tonsillolith.  IMPRESSION:  1.  No acute intracranial pathology seen on CT. 2.  Mild small vessel ischemic microangiopathy. 3.  New significant bilateral adenoid prominence, with tiny associated tonsillolith. 4.  Near complete opacification of the sphenoid sinus with slightly high attenuation fluid, just above the prominent adenoids.  This is also new from the prior study.  Original Report Authenticated By: Tonia Ghent, M.D.    Medications:  Scheduled:   . fluticasone  2 spray Each Nare Daily  . guaiFENesin  600 mg Oral BID  . hydrochlorothiazide  12.5 mg Oral Daily  . LORazepam  0.5 mg Oral Q8H  . losartan  50 mg Oral Daily  . multivitamins ther. w/minerals  1 tablet Oral Daily  . omega-3 acid ethyl esters  1 g Oral BID  . simvastatin  20 mg Oral QHS  . sodium chloride  3 mL Intravenous Q12H    Principal Problem:  *6th nerve palsy Active Problems:  Headache  HTN (hypertension)   Assessment/Plan: #1. Headache: Etiology remains unclear. Possibly related to sinusitis. MRI report pending. Try motrin for headaches.  #2. 6th Nerve palsy: Unclear etiology. Neurology following. Await further input once MRI is available.  #3. Chronic sinusitis: Flonase. Normal WBC. Afebrile. No need for antibiotics at this time. May need referral to ENT as OP. Will defer to PCP.  #4. Uterine cancer status post hysterectomy and chemotherapy, last chemotherapy dose this April. Stable.  #5. Hypertension: Stable  #6. History of hyperlipidemia: on Zocor.  #7. DVT prophylaxis: SCD's  Ambulate in hallway. PT/OT   LOS: 2 days   [facility] Pager [phone] 06/15/2011, 8:51 AM  Called by radiologist regarding MRI findings.  IMPRESSION:  Sphenoid sinusitis with extension through the clivus  into the dura. No epidural abscess. 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 Gyriform enhancement left occipital lobe most likely related to cerebritis from infection. Small focus of restricted diffusion left parietal lobe may represent infarct or early cerebritis. Consider fungal sinusitis.  Plan: Extensive sphenoid sinusitis with extension into cavernous sinus. Likely explains patient's presentation.  Discussed with Dr. Ezzard Standing and he recommends antibiotics now. He will probably take her to the OR later today.  Discussed with Dr. Daiva Eves as well.  Will start Vanc, Imipenem, Voraconazole  Informed patient and answered her questions.  Buddie Marston 06/15/2011 10:01 AM

## 2011-06-15 NOTE — Anesthesia Preprocedure Evaluation (Signed)
Anesthesia Evaluation  Patient identified by MRN, date of birth, ID band Patient awake    Reviewed: Allergy & Precautions, H&P , NPO status , Patient's Chart, lab work & pertinent test results  Airway Mallampati: III TM Distance: >3 FB Neck ROM: full    Dental  (+) Partial Lower   Pulmonary  clear to auscultation        Cardiovascular hypertension, On Medications regular Normal    Neuro/Psych  Headaches,    GI/Hepatic   Endo/Other    Renal/GU      Musculoskeletal   Abdominal   Peds  Hematology   Anesthesia Other Findings   Reproductive/Obstetrics                           Anesthesia Physical Anesthesia Plan  ASA: III  Anesthesia Plan: General   Post-op Pain Management:    Induction: Intravenous  Airway Management Planned: Oral ETT  Additional Equipment:   Intra-op Plan:   Post-operative Plan: Extubation in OR  Informed Consent: I have reviewed the patients History and Physical, chart, labs and discussed the procedure including the risks, benefits and alternatives for the proposed anesthesia with the patient or authorized representative who has indicated his/her understanding and acceptance.   Dental advisory given  Plan Discussed with: CRNA  Anesthesia Plan Comments:         Anesthesia Quick Evaluation

## 2011-06-16 ENCOUNTER — Observation Stay (HOSPITAL_COMMUNITY): Payer: Medicare Other | Admitting: Anesthesiology

## 2011-06-16 ENCOUNTER — Encounter (HOSPITAL_COMMUNITY): Payer: Self-pay | Admitting: Anesthesiology

## 2011-06-16 ENCOUNTER — Other Ambulatory Visit: Payer: Self-pay | Admitting: Internal Medicine

## 2011-06-16 ENCOUNTER — Other Ambulatory Visit: Payer: Self-pay | Admitting: Otolaryngology

## 2011-06-16 DIAGNOSIS — J329 Chronic sinusitis, unspecified: Secondary | ICD-10-CM

## 2011-06-16 LAB — CBC
HCT: 36.4 % (ref 36.0–46.0)
Hemoglobin: 12.5 g/dL (ref 12.0–15.0)
MCH: 30.1 pg (ref 26.0–34.0)
MCHC: 34.3 g/dL (ref 30.0–36.0)
MCV: 87.7 fL (ref 78.0–100.0)
RDW: 13.2 % (ref 11.5–15.5)

## 2011-06-16 LAB — VANCOMYCIN, RANDOM: Vancomycin Rm: 15.3 ug/mL

## 2011-06-16 LAB — BASIC METABOLIC PANEL
Calcium: 7.8 mg/dL — ABNORMAL LOW (ref 8.4–10.5)
Creatinine, Ser: 1.21 mg/dL — ABNORMAL HIGH (ref 0.50–1.10)
GFR calc non Af Amer: 43 mL/min — ABNORMAL LOW (ref >90)
Sodium: 133 mEq/L — ABNORMAL LOW (ref 135–145)

## 2011-06-16 MED ORDER — EPINEPHRINE HCL (NASAL) 0.1 % NA SOLN
NASAL | Status: DC | PRN
  Administered 2011-06-16: 1 [drp] via NASAL

## 2011-06-16 MED ORDER — HEMOSTATIC AGENTS (NO CHARGE) OPTIME
TOPICAL | Status: DC | PRN
  Administered 2011-06-16: 1 via TOPICAL

## 2011-06-16 MED ORDER — PROMETHAZINE HCL 25 MG/ML IJ SOLN: 6.2500 mg | INTRAMUSCULAR | Status: DC | PRN

## 2011-06-16 MED ORDER — HYDROMORPHONE HCL PF 1 MG/ML IJ SOLN: 0.2500 mg | INTRAMUSCULAR | Status: DC | PRN

## 2011-06-16 MED ORDER — SCOPOLAMINE 1 MG/3DAYS TD PT72
MEDICATED_PATCH | TRANSDERMAL | Status: DC | PRN
  Administered 2011-06-16: 1 via TRANSDERMAL

## 2011-06-16 MED ORDER — NEOSTIGMINE METHYLSULFATE 1 MG/ML IJ SOLN
INTRAMUSCULAR | Status: DC | PRN
  Administered 2011-06-16: 3.5 mg via INTRAVENOUS

## 2011-06-16 MED ORDER — LIDOCAINE-EPINEPHRINE 1 %-1:100000 IJ SOLN
INTRAMUSCULAR | Status: DC | PRN
  Administered 2011-06-16: 7 mL

## 2011-06-16 MED ORDER — OXYMETAZOLINE HCL 0.05 % NA SOLN
NASAL | Status: DC | PRN
  Administered 2011-06-16: 1 via NASAL

## 2011-06-16 MED ORDER — PROPOFOL 10 MG/ML IV EMUL
INTRAVENOUS | Status: DC | PRN
  Administered 2011-06-16: 120 mg via INTRAVENOUS

## 2011-06-16 MED ORDER — FENTANYL CITRATE 0.05 MG/ML IJ SOLN
INTRAMUSCULAR | Status: DC | PRN
Start: 2011-06-16 — End: 2011-06-16
  Administered 2011-06-16 (×2): 50 ug via INTRAVENOUS
  Administered 2011-06-16 (×2): 25 ug via INTRAVENOUS
  Administered 2011-06-16: 100 ug via INTRAVENOUS

## 2011-06-16 MED ORDER — ONDANSETRON HCL 4 MG/2ML IJ SOLN
INTRAMUSCULAR | Status: DC | PRN
  Administered 2011-06-16: 4 mg via INTRAVENOUS

## 2011-06-16 MED ORDER — SODIUM CHLORIDE 0.9 % IV SOLN
INTRAVENOUS | Status: DC | PRN
  Administered 2011-06-16: 01:00:00 via INTRAVENOUS

## 2011-06-16 MED ORDER — GLYCOPYRROLATE 0.2 MG/ML IJ SOLN
INTRAMUSCULAR | Status: DC | PRN
  Administered 2011-06-16: .6 mg via INTRAVENOUS

## 2011-06-16 MED ORDER — EPHEDRINE SULFATE 50 MG/ML IJ SOLN
INTRAMUSCULAR | Status: DC | PRN
  Administered 2011-06-16: 5 mg via INTRAVENOUS
  Administered 2011-06-16: 10 mg via INTRAVENOUS
  Administered 2011-06-16 (×2): 5 mg via INTRAVENOUS

## 2011-06-16 MED ORDER — ROCURONIUM BROMIDE 100 MG/10ML IV SOLN
INTRAVENOUS | Status: DC | PRN
  Administered 2011-06-16: 50 mg via INTRAVENOUS

## 2011-06-16 MED ORDER — SODIUM CHLORIDE 0.9 % IR SOLN
Status: DC | PRN
  Administered 2011-06-16: 1000 mL

## 2011-06-16 MED ORDER — SODIUM CHLORIDE 0.9 % IV SOLN
500.0000 mg | Freq: Three times a day (TID) | INTRAVENOUS | Status: DC
  Filled 2011-06-16 (×3): qty 500

## 2011-06-16 MED ORDER — LACTATED RINGERS IV SOLN: INTRAVENOUS | Status: DC | PRN

## 2011-06-16 MED ORDER — DEXAMETHASONE SODIUM PHOSPHATE 10 MG/ML IJ SOLN
INTRAMUSCULAR | Status: DC | PRN
  Administered 2011-06-16: 4 mg via INTRAVENOUS

## 2011-06-16 MED ORDER — MEPERIDINE HCL 25 MG/ML IJ SOLN: 6.2500 mg | INTRAMUSCULAR | Status: DC | PRN

## 2011-06-16 NOTE — Progress Notes (Signed)
ANTIBIOTIC CONSULT NOTE - INITIAL  Pharmacy Consult for Vancomycin / Primaxin  Indication: Headache / Sinusitis / Empiric coverage  Assessment:  75 year old admitted with headache and sinusitis, Wt. = 105 kg, CrCl significantly decreased from yesterday.   Goal of Therapy:  1) Vancomycin trough = 15 to 20 mcg/dl 2) appropriate Primaxin dosing  Plan:  1) Vancomycin  2) Primaxin 500 mg IV q 8 hours 3) f/u resuming Zocor since voriconazole discontinued by ID. 5) Will to follow plan, cultures, and renal function.  Thank you.  Wyline Copas 06/16/2011,12:42 PM  Allergies  Allergen Reactions  . Amlodipine Besylate-Valsartan     REACTION: myalgias  . Sulfa Antibiotics Swelling  . Sulfonamide Derivatives   . Benadryl (Diphenhydramine Hcl) Palpitations    Patient Measurements: Height: 5\' 3"  (160 cm) Weight: 236 lb 8.9 oz (107.3 kg) IBW/kg (Calculated) : 52.4    Vital Signs: Temp: 98.2 F (36.8 C) (12/08 1147) Temp src: Oral (12/08 1147) BP: 123/52 mmHg (12/08 1147) Pulse Rate: 89  (12/08 1147) Intake/Output from previous day: 12/07 0701 - 12/08 0700 In: 2200 [I.V.:2100; IV Piggyback:100] Out: 850 [Urine:350; Blood:500] Intake/Output from this shift: Total I/O In: 200 [I.V.:200] Out: 150 [Urine:150]  Labs:  Basename 06/16/11 0730 06/15/11 0600 06/14/11 0222  WBC 15.4* 8.6 9.1  HGB 12.5 13.9 14.6  PLT 176 198 198  LABCREA -- -- --  CREATININE 1.21* 0.87 0.83   Estimated Creatinine Clearance: 48.6 ml/min (by C-G formula based on Cr of 1.21). No results found for this basename: VANCOTROUGH:2,VANCOPEAK:2,VANCORANDOM:2,GENTTROUGH:2,GENTPEAK:2,GENTRANDOM:2,TOBRATROUGH:2,TOBRAPEAK:2,TOBRARND:2,AMIKACINPEAK:2,AMIKACINTROU:2,AMIKACIN:2, in the last 72 hours   Microbiology: Recent Results (from the past 720 hour(s))  CULTURE, BLOOD (ROUTINE X 2)     Status: Normal (Preliminary result)   Collection Time   06/15/11 12:50 PM      Component Value Range Status Comment   Specimen Description BLOOD RIGHT ARM   Final    Special Requests BOTTLES DRAWN AEROBIC ONLY 10CC   Final    Setup Time [id number]   Final    Culture     Final    Value:        BLOOD CULTURE RECEIVED NO GROWTH TO DATE CULTURE WILL BE HELD FOR 5 DAYS BEFORE ISSUING A FINAL NEGATIVE REPORT   Report Status PENDING   Incomplete   MRSA PCR SCREENING     Status: Normal   Collection Time   06/15/11 10:04 PM      Component Value Range Status Comment   MRSA by PCR NEGATIVE  NEGATIVE  Final     Medical History: Past Medical History  Diagnosis Date  . Uterine cancer     s/p surgical removal, chemo and radiation  . Hypertension   . Headache   . Peripheral neuropathy     Related to chemotherapy    Medications:  Scheduled:     . guaiFENesin  600 mg Oral BID  . hydrochlorothiazide  12.5 mg Oral Daily  . ibuprofen  400 mg Oral Once  . imipenem-cilastatin  500 mg Intravenous Q8H  . LORazepam  0.5 mg Oral Q8H  . losartan  50 mg Oral Daily  . multivitamins ther. w/minerals  1 tablet Oral Daily  . omega-3 acid ethyl esters  1 g Oral BID  . sodium chloride  500 mL Intravenous UD  . sodium chloride  3 mL Intravenous Q12H  . DISCONTD: amphotericin-B lipid  500 mg Intravenous Q24H  . DISCONTD: fluticasone  2 spray Each Nare Daily  . DISCONTD: imipenem-cilastatin  500 mg Intravenous Q6H  . DISCONTD: vancomycin  1,500 mg Intravenous Q12H

## 2011-06-16 NOTE — SANE Note (Signed)
S/P R sphenoidotomy last night Minimal bleeding since surgery.  H/h today 12.5/36. Stable post op. Feeling OK except sorethroat. 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 Biopsies were obtained and results should be read Mon or Tues.  Patient will need to be treated at [facility]. I have already contacted H&N resident and they will be happy to see her and will make f/u arrangements post discharge this next week.

## 2011-06-16 NOTE — Op Note (Signed)
[name], [name]               ACCOUNT NO.:  [id number]  MEDICAL RECORD NO.:  [id number]  LOCATION:  [facility]                         FACILITY:  [facility]  PHYSICIAN:  Kristine Garbe. Ezzard Standing, M.D.DATE OF BIRTH:  August 19, 1935  DATE OF PROCEDURE:  06/16/2011 DATE OF DISCHARGE:                              OPERATIVE REPORT   PREOPERATIVE DIAGNOSIS:  Chronic right sphenoid sinusitis with the right cavernous sinus thrombophlebitis and cerebritis on MRI scan  POSTOPERATIVE DIAGNOSIS:  Right sphenoid sinus vascular tumor.  OPERATION PERFORMED:  Right functional endoscopic sinus surgery with right ethmoidectomy, right sphenoidotomy with biopsy of a vascular tumor.  SURGEON:  Kristine Garbe. Ezzard Standing, M.D.  ANESTHESIA:  General endotracheal.  COMPLICATIONS:  None.  ESTIMATED BLOOD LOSS:  150 mL.  BRIEF CLINICAL NOTE:  [name] is a 75 year old female, who has been having a persistent headache now for over 4 weeks.  She has previously had a CT scan, which showed some sinus disease and had been treated with amoxicillin as well as 2 rounds of Z-Pak, without improvement in her headache.  She subsequently, a week or two ago, developed some double vision with weakness of her right 6th cranial nerve unable to abduct her right eye.  Her headache has gotten worse and she presented to the [facility], at which time she had a CT scan and MRI scan, and these showed complete opacification of the sphenoid sinus with the findings consistent with  right cavernous sinus thrombophlebitis and cerebritis.  It was felt that she had chronic sinus infection causing the headaches and weakness of the right abducens nerve.  She is taken to the operating room at this time for drainage of the sphenoid sinus and IV antibiotics.  She has a past medical history of uterine cancer diagnosed last year, status post hysterectomy, followed by chemotherapy, which was completed in April 2012.  She also has a history  of hypertension.  DESCRIPTION OF PROCEDURE:  The patient was brought to the operating room and underwent a general endotracheal anesthesia.  She continued with her IV antibiotics.  The nose was prepped with cotton pledgets soaked in 1:1000 epinephrine and the right middle turbinate and right middle meatus area was injected with Xylocaine with epinephrine for hemostasis. On review of her CT scan, her sinuses were all clear except for the sphenoid sinus, which was completely opacified, worse on the right side. Right ethmoidectomy was performed with a microdebrider and the inferior 1/3 to 1/2 of the right middle turbinate was amputated to obtain better exposure of the sphenoid sinus face.  Of note, on endoscopy, she also had very prominent adenoid-type tissue in the nasopharyx, more so on the right, which was fairly vascular to excision.  Some of this adenoid area and posterior nasal cavity tissue was removed and sent to Pathology.  The posterior ethmoid area was opened up and this area was fairly vascular, which was controlled with suction cautery and then the sphenoid sinus was entered.  On entering the sphenoid sinus, there was actually bright-red bleeding and this was controlled with a cotton pledgets soaked in 1:1000 epinephrine. After enlarging the sphenoidotomy to approximately 1 cm size and carefully removing the adrenaline soaked  pack, a pulsating mass was noted in the sphenoid sinus.  A few small biopsies were obtained from the pulsating mass and sent as specimen labeled sphenoid sinus vascular mass.  Other tissue sent to Pathology was a posterior nasal cavity mass. This area bled fairly briskly after biopsy and this was controlled adequately with suction cautery and pledgets soaked in epinephrine.  After obtaining adequate hemostasis with suction cautery and epinephrine- soaked sponges, the sphenoid area was packed with some pledgets of Gelfoam.  I stuck in the opening of the  sphenoidotomy and then a large Merocel sponge is placed within the nasal cavity and ethmoid area.  The left nasal cavity was examined with the endoscope.  There was just a minimal amount of bleeding coming from the posterior nasal cavity following the procedure.  The patient was subsequently awoken from anesthesia and transferred to the recovery room, postop doing well with minimal bleeding.  Depending on results of biopsy, she will probably require transfer to [facility] for resection of this up vascular mass.  She will be admitted to the ICU for overnight observation for any bleeding.          ______________________________ Kristine Garbe. Ezzard Standing, M.D.     CEN/MEDQ  D:  06/16/2011  T:  06/16/2011  Job:  [id number]

## 2011-06-16 NOTE — Progress Notes (Signed)
SCr jumped from 0.87 to 1.21 after 1 dose of vancomycin and amphotericin B, both renotoxic drugs. Will check a stat vanc level to assess vanc dose and hold the next vanc dose scheduled for 1pm. Ampho B has already been discontinued by ID.

## 2011-06-16 NOTE — Transfer of Care (Signed)
Immediate Anesthesia Transfer of Care Note  Patient: [name]  Procedure(s) Performed:  ENDOSCOPIC SINUS SURGERY; SPHENOIDECTOMY  Patient Location: PACU  Anesthesia Type: General  Level of Consciousness: awake, alert  and oriented  Airway & Oxygen Therapy: Patient Spontanous Breathing and Patient connected to face mask oxygen  Post-op Assessment: Report given to PACU RN and Post -op Vital signs reviewed and stable  Post vital signs: Reviewed and stable  Complications: No apparent anesthesia complications

## 2011-06-16 NOTE — Brief Op Note (Signed)
06/13/2011 - 06/16/2011  2:58 AM  PATIENT:  [name]  75 y.o. female  PRE-OPERATIVE DIAGNOSIS:  chronic sphenoid sinusitus  POST-OPERATIVE DIAGNOSIS: Sphenoid sinus vascular tumor  PROCEDURE:  Procedure(s): ENDOSCOPIC SINUS SURGERY WITH RIGHT ETHMOIDECTOMY and RIGHT SPHENOIDOTOMY WITH BIOPSY   SURGEON:  Surgeon(s): Drema Halon, MD  PHYSICIAN ASSISTANT:   ASSISTANTS: none   ANESTHESIA:   general  EBL:  Total I/O In: 1450 [I.V.:1350; IV Piggyback:100] Out: 500 [Urine:350; Blood:150]  BLOOD ADMINISTERED:none  DRAINS: none   LOCAL MEDICATIONS USED:  XYLOCAINE with epiCC  SPECIMEN:  Source of Specimen:  posterior nasal cavity and sphenoid sinus right  DISPOSITION OF SPECIMEN:  PATHOLOGY  COUNTS:  YES  TOURNIQUET:  * No tourniquets in log *  DICTATION: .Other Dictation: Dictation Number   [id number]  PLAN OF CARE: Admit to inpatient   PATIENT DISPOSITION:  PACU - hemodynamically stable.   Delay start of Pharmacological VTE agent (>24hrs) due to surgical blood loss or risk of bleeding:  {YES/NO/NOT APPLICABLE:20182

## 2011-06-16 NOTE — Progress Notes (Signed)
Patient ID: [name], female   DOB: [birth year], 75 y.o.   MRN: [id number] INFECTIOUS DISEASE PROGRESS NOTE   Date of Admission:  06/13/2011   Day 2 total antibiotics     . guaiFENesin  600 mg Oral BID  . hydrochlorothiazide  12.5 mg Oral Daily  . ibuprofen  400 mg Oral Once  . imipenem-cilastatin  500 mg Intravenous Q8H  . LORazepam  0.5 mg Oral Q8H  . losartan  50 mg Oral Daily  . multivitamins ther. w/minerals  1 tablet Oral Daily  . omega-3 acid ethyl esters  1 g Oral BID  . sodium chloride  500 mL Intravenous UD  . sodium chloride  3 mL Intravenous Q12H  . DISCONTD: amphotericin-B lipid  500 mg Intravenous Q24H  . DISCONTD: fluticasone  2 spray Each Nare Daily  . DISCONTD: imipenem-cilastatin  500 mg Intravenous Q6H  . DISCONTD: vancomycin  1,500 mg Intravenous Q12H    Objective: Temp (24hrs), Avg:98.2 F (36.8 C), Min:97.7 F (36.5 C), Max:99.8 F (37.7 C)    Lab Results Lab Results  Component Value Date   WBC 15.4* 06/16/2011   HGB 12.5 06/16/2011   HCT 36.4 06/16/2011   MCV 87.7 06/16/2011   PLT 176 06/16/2011    Lab Results  Component Value Date   CREATININE 1.21* 06/16/2011   BUN 17 06/16/2011   NA 133* 06/16/2011   K 3.7 06/16/2011   CL 100 06/16/2011   CO2 21 06/16/2011    Lab Results  Component Value Date   ALT 14 06/14/2011   AST 18 06/14/2011   ALKPHOS 140* 06/14/2011   BILITOT 0.5 06/14/2011     Microbiology: Recent Results (from the past 240 hour(s))  CULTURE, BLOOD (ROUTINE X 2)     Status: Normal (Preliminary result)   Collection Time   06/15/11 12:50 PM      Component Value Range Status Comment   Specimen Description BLOOD RIGHT ARM   Final    Special Requests BOTTLES DRAWN AEROBIC ONLY 10CC   Final    Setup Time [id number]   Final    Culture     Final    Value:        BLOOD CULTURE RECEIVED NO GROWTH TO DATE CULTURE WILL BE HELD FOR 5 DAYS BEFORE ISSUING A FINAL NEGATIVE REPORT   Report Status PENDING   Incomplete   FUNGUS CULTURE, BLOOD      Status: Normal (Preliminary result)   Collection Time   06/15/11 12:55 PM      Component Value Range Status Comment   Specimen Description BLOOD RIGHT ARM   Final    Special Requests BOTTLES DRAWN AEROBIC ONLY 10CC   Final    Culture NO FUNGUS ISOLATED;CULTURE IN PROGRESS FOR 7 DAYS   Final    Report Status PENDING   Incomplete   MRSA PCR SCREENING     Status: Normal   Collection Time   06/15/11 10:04 PM      Component Value Range Status Comment   MRSA by PCR NEGATIVE  NEGATIVE  Final   TISSUE CULTURE     Status: Normal (Preliminary result)   Collection Time   06/16/11  1:34 AM      Component Value Range Status Comment   Specimen Description TISSUE   Final    Special Requests     Final    Value: RIGHT POSTERIOR NASOPHARENGEAL AND POSTERIOR NASAL CAVITY MASS PATIENT ON FOLLOWING VANCOMYCIN AND PRIMAXIN   Gram Stain  Final    Value: NO WBC SEEN     NO SQUAMOUS EPITHELIAL CELLS SEEN     NO ORGANISMS SEEN   Culture PENDING   Incomplete    Report Status PENDING   Incomplete   ANAEROBIC CULTURE     Status: Normal (Preliminary result)   Collection Time   06/16/11  1:34 AM      Component Value Range Status Comment   Specimen Description TISSUE   Final    Special Requests     Final    Value: RIGHT POSTERIOR NASOPHARENGEAL AND POSTERIOR NASAL CAVITY MASS PATIENT ON FOLLOWING VANCOMYCIN AND PRIMAXIN   Gram Stain     Final    Value: NO WBC SEEN     NO SQUAMOUS EPITHELIAL CELLS SEEN     NO ORGANISMS SEEN   Culture PENDING   Incomplete    Report Status PENDING   Incomplete     Studies/Results: Mr Angiogram Head Wo Contrast  06/15/2011  *RADIOLOGY REPORT*  Clinical Data:  Six nerve palsy right side.  Headache.  Sinus infection treated with antibiotics.  MRI HEAD WITHOUT AND WITH CONTRAST MRA HEAD WITHOUT CONTRAST MRA NECK WITHOUT AND WITH CONTRAST  Technique:  Multiplanar, multiecho pulse sequences of the brain and surrounding structures were obtained without and with intravenous  contrast.  Angiographic images of the Circle of Willis were obtained using MRA technique without intravenous contrast. Angiographic images of the neck were obtained using MRA technique without and with intravenous contrast.  Carotid stenosis measurements (when applicable) are obtained utilizing NASCET criteria, using the distal internal carotid diameter as the denominator.  Contrast: 20mL MULTIHANCE GADOBENATE DIMEGLUMINE 529 MG/ML IV SOLN  Comparison:  CT head 06/14/2011  MRI HEAD  Findings:  Complete opacification of the sphenoid sinus bilaterally.  There is thickening of the dura posterior to the clivus.  The dura is thickened and shows diffuse solid enhancement in this area.  Findings are compatible with sinusitis extending through the clivus to the dura.  No epidural abscess is identified. 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  Left cavernous sinus appears normal.  Mild mucosal edema in the ethmoid and maxillary sinuses bilaterally.  Mild mucosal edema in the mastoid sinuses bilaterally.  Abnormal signal in the left medial occipital lobe with gyriform cortical enhancement and surrounding white matter edema.  This area shows minimal restricted diffusion.  I would favor this represents cerebritis rather than infarction.  No other areas of abnormal enhancement in the brain.  2 mm restricted diffusion in the left parietal lobe may represent infarct or an early area of cerebritis.  IMPRESSION: Sphenoid sinusitis with extension through the clivus into the dura. No epidural abscess.  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  Gyriform enhancement left occipital lobe most likely related to cerebritis from infection.  Small focus of restricted diffusion left parietal lobe may represent infarct or early cerebritis.  Consider fungal  sinusitis.  MRA HEAD  Findings: Both vertebral arteries are patent to the basilar.  PICA is patent bilaterally.  Superior cerebellar and posterior cerebral arteries are patent bilaterally.  Patent posterior communicating artery bilaterally.  Internal carotid artery is patent bilaterally.  No significant stenosis in the cavernous segment.  Anterior and middle cerebral arteries are patent bilaterally.  Negative for cerebral aneurysm.  IMPRESSION: Negative  MRA NEC  Findings: Both  carotid arteries are widely patent without significant stenosis.  Both vertebral arteries are patent to the basilar without significant stenosis.  Left vertebral artery is dominant.  IMPRESSION: Negative  Critical Value/emergent results were called by telephone at the time of interpretation on 06/15/2011  at 0900 hours  to  Dr. Rito Ehrlich, who verbally acknowledged these results.  Original Report Authenticated By: Camelia Phenes, M.D.   Mr Angiogram Neck W Wo Contrast  06/15/2011   *RADIOLOGY REPORT*  Clinical Data:  Six nerve palsy right side.  Headache.  Sinus infection treated with antibiotics.  MRI HEAD WITHOUT AND WITH CONTRAST MRA HEAD WITHOUT CONTRAST MRA NECK WITHOUT AND WITH CONTRAST  Technique:  Multiplanar, multiecho pulse sequences of the brain and surrounding structures were obtained without and with intravenous contrast.  Angiographic images of the Circle of Willis were obtained using MRA technique without intravenous contrast. Angiographic images of the neck were obtained using MRA technique without and with intravenous contrast.  Carotid stenosis measurements (when applicable) are obtained utilizing NASCET criteria, using the distal internal carotid diameter as the denominator.  Contrast: 20mL MULTIHANCE GADOBENATE DIMEGLUMINE 529 MG/ML IV SOLN  Comparison:  CT head 06/14/2011  MRI HEAD  Findings:  Complete opacification of the sphenoid sinus bilaterally.  There is thickening of the dura posterior to the clivus.  The dura is  thickened and shows diffuse solid enhancement in this area.  Findings are compatible with sinusitis extending through the clivus to the dura.  No epidural abscess is identified. 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  Left cavernous sinus appears normal.  Mild mucosal edema in the ethmoid and maxillary sinuses bilaterally.  Mild mucosal edema in the mastoid sinuses bilaterally.  Abnormal signal in the left medial occipital lobe with gyriform cortical enhancement and surrounding white matter edema.  This area shows minimal restricted diffusion.  I would favor this represents cerebritis rather than infarction.  No other areas of abnormal enhancement in the brain.  2 mm restricted diffusion in the left parietal lobe may represent infarct or an early area of cerebritis.  IMPRESSION: Sphenoid sinusitis with extension through the clivus into the dura. No epidural abscess.  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  Gyriform enhancement left occipital lobe most likely related to cerebritis from infection.  Small focus of restricted diffusion left parietal lobe may represent infarct or early cerebritis.  Consider fungal sinusitis.  MRA HEAD  Findings: Both vertebral arteries are patent to the basilar.  PICA is patent bilaterally.  Superior cerebellar and posterior cerebral arteries are patent bilaterally.  Patent posterior communicating artery bilaterally.  Internal carotid artery is patent bilaterally.  No significant stenosis in the cavernous segment.  Anterior and middle cerebral arteries are patent bilaterally.  Negative for cerebral aneurysm.  IMPRESSION: Negative  MRA NEC  Findings: Both carotid arteries are widely patent without significant stenosis.  Both vertebral arteries are patent to the basilar without significant stenosis.   Left vertebral artery is dominant.  IMPRESSION: Negative  Critical Value/emergent results were called by telephone at the time of interpretation on 06/15/2011  at 0900 hours  to  Dr. Rito Ehrlich, who verbally acknowledged these results.  Original Report Authenticated By: Camelia Phenes, M.D.   Mr Laqueta Jean Wo Contrast  06/15/2011  *RADIOLOGY REPORT*  Clinical Data:  Six nerve palsy right side.  Headache.  Sinus infection treated with antibiotics.  MRI HEAD WITHOUT AND WITH CONTRAST MRA HEAD WITHOUT CONTRAST MRA NECK WITHOUT AND WITH CONTRAST  Technique:  Multiplanar, multiecho pulse sequences of the brain and surrounding structures were obtained without and with intravenous contrast.  Angiographic images of the Circle of Willis were obtained using MRA technique without intravenous contrast. Angiographic images of the neck were obtained using MRA technique without and with intravenous contrast.  Carotid stenosis measurements (when applicable) are obtained utilizing NASCET criteria, using the distal internal carotid diameter as the denominator.  Contrast: 20mL MULTIHANCE GADOBENATE DIMEGLUMINE 529 MG/ML IV SOLN  Comparison:  CT head 06/14/2011  MRI HEAD  Findings:  Complete opacification of the sphenoid sinus bilaterally.  There is thickening of the dura posterior to the clivus.  The dura is thickened and shows diffuse solid enhancement in this area.  Findings are compatible with sinusitis extending through the clivus to the dura.  No epidural abscess is identified. 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  Left cavernous sinus appears normal.  Mild mucosal edema in the ethmoid and maxillary sinuses bilaterally.  Mild mucosal edema in the mastoid sinuses bilaterally.  Abnormal signal in the left medial occipital lobe with gyriform cortical enhancement and surrounding white matter edema.  This area shows  minimal restricted diffusion.  I would favor this represents cerebritis rather than infarction.  No other areas of abnormal enhancement in the brain.  2 mm restricted diffusion in the left parietal lobe may represent infarct or an early area of cerebritis.  IMPRESSION: Sphenoid sinusitis with extension through the clivus into the dura. No epidural abscess.  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  Gyriform enhancement left occipital lobe most likely related to cerebritis from infection.  Small focus of restricted diffusion left parietal lobe may represent infarct or early cerebritis.  Consider fungal sinusitis.  MRA HEAD  Findings: Both vertebral arteries are patent to the basilar.  PICA is patent bilaterally.  Superior cerebellar and posterior cerebral arteries are patent bilaterally.  Patent posterior communicating artery bilaterally.  Internal carotid artery is patent bilaterally.  No significant stenosis in the cavernous segment.  Anterior and middle cerebral arteries are patent bilaterally.  Negative for cerebral aneurysm.  IMPRESSION: Negative  MRA NEC  Findings: Both carotid arteries are widely patent without significant stenosis.  Both vertebral arteries are patent to the basilar without significant stenosis.  Left vertebral artery is dominant.  IMPRESSION: Negative  Critical Value/emergent results were called by telephone at the time of interpretation on 06/15/2011  at 0900 hours  to  Dr. Rito Ehrlich, who verbally acknowledged these results.  Original Report Authenticated By: Camelia Phenes, M.D.     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  Plan: 1. Discontinue imipenam 2. Vancomycin and Amphotericin already stopped. 3. I will sign off now. Please call if we can assist further.   Cliffton Asters, MD [facility] [phone] pager   [phone] cell 06/16/2011, 2:36 PM

## 2011-06-16 NOTE — Anesthesia Postprocedure Evaluation (Signed)
  Anesthesia Post-op Note  Patient: [name]  Procedure(s) Performed:  ENDOSCOPIC SINUS SURGERY; SPHENOIDECTOMY  Patient Location: PACU  Anesthesia Type: General  Level of Consciousness: awake  Airway and Oxygen Therapy: Patient Spontanous Breathing  Post-op Pain: mild  Post-op Assessment: Post-op Vital signs reviewed, Patient's Cardiovascular Status Stable, Respiratory Function Stable, Patent Airway and No signs of Nausea or vomiting  Post-op Vital Signs: Reviewed and stable  Complications: No apparent anesthesia complications

## 2011-06-16 NOTE — Anesthesia Procedure Notes (Signed)
Procedure Name: Intubation Date/Time: 06/16/2011 12:27 AM Performed by: Julianne Rice Z Pre-anesthesia Checklist: Patient identified, Timeout performed, Emergency Drugs available, Suction available and Patient being monitored Patient Re-evaluated:Patient Re-evaluated prior to inductionOxygen Delivery Method: Circle System Utilized Preoxygenation: Pre-oxygenation with 100% oxygen Intubation Type: IV induction and Circoid Pressure applied Ventilation: Mask ventilation without difficulty Grade View: Grade I Tube type: Oral Tube size: 7.5 mm Number of attempts: 1 Airway Equipment and Method: video-laryngoscopy Placement Confirmation: ETT inserted through vocal cords under direct vision,  breath sounds checked- equal and bilateral and positive ETCO2 Secured at: 21 cm Tube secured with: Tape Dental Injury: Teeth and Oropharynx as per pre-operative assessment

## 2011-06-16 NOTE — Progress Notes (Signed)
PCP: Dr. Dimas Aguas at [facility] in [place], [place]  Brief narrative:  75 year-old female with history of hypertension and history of uterine cancer status post hysterectomy last year followed by chemotherapy, the last one was this year in April, has been experiencing headache for the last 4 weeks and was given antibiotic course for sinusitis. Then the patient developed diplopia and she had gone to [facility] hospital where she had a CAT scan of the head done. As per patient CAT scan head only showed sinusitis. Since then she's been having difficulty to abduct her right eye. She followed with the primary care physician who referred her to ophthalmologist and was told she had one of the nerves paralyzed. She had an appointment with neurologist. But the headache got worse so she came to the ER. Patient was found to have extensive sphenoid sinusitis extending into the right cavernous sinus requiring surgical intervention on 06/15/11.  Past medical history: Uterine Cancer and Hypertension  Consultants: Neurology, ENT (Dr. Ezzard Standing), ID [facility] [place])  Procedures: 06/15/11: Right endoscopic sinus surgery with right ethmoidectomy, right sphenoidectomy and biopsy of vascular tumor.  Subjective: Patient feels better wrt headache. Concerned about the tumor. Denies any pain.  Objective: Vital signs in last 24 hours: Temp:  [97.2 F (36.2 C)-99.8 F (37.7 C)] 98.1 F (36.7 C) (12/08 0731) Pulse Rate:  [77-102] 94  (12/08 0731) Resp:  [13-30] 25  (12/08 0731) BP: (118-147)/(53-103) 121/53 mmHg (12/08 0731) SpO2:  [91 %-99 %] 92 % (12/08 0731) Weight:  [107.3 kg (236 lb 8.9 oz)] 236 lb 8.9 oz (107.3 kg) (12/08 0500) Weight change:  Last BM Date: 06/15/11  Intake/Output from previous day: 12/07 0701 - 12/08 0700 In: 2100 [I.V.:2000; IV Piggyback:100] Out: 850 [Urine:350; Blood:500] Intake/Output this shift:    General appearance: alert, cooperative and no distress Head: Normocephalic, without obvious  abnormality, atraumatic Eyes: negative findings: lids and lashes normal, conjunctivae and sclerae normal and corneas clear, Right eye does not abduct. Neck: no adenopathy, no carotid bruit, no JVD, supple, symmetrical, trachea midline and thyroid not enlarged, symmetric, no tenderness/mass/nodules Resp: clear to auscultation bilaterally Cardio: regular rate and rhythm, S1, S2 normal, no murmur, click, rub or gallop GI: soft, non-tender; bowel sounds normal; no masses,  no organomegaly Extremities: extremities normal, atraumatic, no cyanosis or edema Pulses: 2+ and symmetric Skin: Skin color, texture, turgor normal. No rashes or lesions Neurologic: Grossly normal  Lab Results:  Basename 06/16/11 0730 06/15/11 0600  WBC 15.4* 8.6  HGB 12.5 13.9  HCT 36.4 40.5  PLT 176 198   BMET  Basename 06/15/11 0600 06/14/11 0222  NA 134* 132*  K 3.8 3.7  CL 97 95*  CO2 28 26  GLUCOSE 102* 102*  BUN 13 14  CREATININE 0.87 0.83  CALCIUM 9.0 8.9    Studies/Results: Mr Angiogram Neck W Wo Contrast  06/15/2011   *RADIOLOGY REPORT*  Clinical Data:  Six nerve palsy right side.  Headache.  Sinus infection treated with antibiotics.  MRI HEAD WITHOUT AND WITH CONTRAST MRA HEAD WITHOUT CONTRAST MRA NECK WITHOUT AND WITH CONTRAST  Technique:  Multiplanar, multiecho pulse sequences of the brain and surrounding structures were obtained without and with intravenous contrast.  Angiographic images of the Circle of Willis were obtained using MRA technique without intravenous contrast. Angiographic images of the neck were obtained using MRA technique without and with intravenous contrast.  Carotid stenosis measurements (when applicable) are obtained utilizing NASCET criteria, using the distal internal carotid diameter as the denominator.  Contrast: 20mL MULTIHANCE GADOBENATE DIMEGLUMINE 529 MG/ML IV SOLN  Comparison:  CT head 06/14/2011  MRI HEAD  Findings:  Complete opacification of the sphenoid sinus bilaterally.   There is thickening of the dura posterior to the clivus.  The dura is thickened and shows diffuse solid enhancement in this area.  Findings are compatible with sinusitis extending through the clivus to the dura.  No epidural abscess is identified. 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  Left cavernous sinus appears normal.  Mild mucosal edema in the ethmoid and maxillary sinuses bilaterally.  Mild mucosal edema in the mastoid sinuses bilaterally.  Abnormal signal in the left medial occipital lobe with gyriform cortical enhancement and surrounding white matter edema.  This area shows minimal restricted diffusion.  I would favor this represents cerebritis rather than infarction.  No other areas of abnormal enhancement in the brain.  2 mm restricted diffusion in the left parietal lobe may represent infarct or an early area of cerebritis.  IMPRESSION: Sphenoid sinusitis with extension through the clivus into the dura. No epidural abscess.  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  Gyriform enhancement left occipital lobe most likely related to cerebritis from infection.  Small focus of restricted diffusion left parietal lobe may represent infarct or early cerebritis.  Consider fungal sinusitis.  MRA HEAD  Findings: Both vertebral arteries are patent to the basilar.  PICA is patent bilaterally.  Superior cerebellar and posterior cerebral arteries are patent bilaterally.  Patent posterior communicating artery bilaterally.  Internal carotid artery is patent bilaterally.  No significant stenosis in the cavernous segment.  Anterior and middle cerebral arteries are patent bilaterally.  Negative for cerebral aneurysm.  IMPRESSION: Negative  MRA NEC  Findings: Both carotid arteries are widely patent without significant stenosis.  Both  vertebral arteries are patent to the basilar without significant stenosis.  Left vertebral artery is dominant.  IMPRESSION: Negative  Critical Value/emergent results were called by telephone at the time of interpretation on 06/15/2011  at 0900 hours  to  Dr. Rito Ehrlich, who verbally acknowledged these results.  Original Report Authenticated By: Camelia Phenes, M.D.    Medications:  Scheduled:    . guaiFENesin  600 mg Oral BID  . hydrochlorothiazide  12.5 mg Oral Daily  . ibuprofen  400 mg Oral Once  . imipenem-cilastatin  500 mg Intravenous Q6H  . LORazepam  0.5 mg Oral Q8H  . losartan  50 mg Oral Daily  . multivitamins ther. w/minerals  1 tablet Oral Daily  . omega-3 acid ethyl esters  1 g Oral BID  . sodium chloride  500 mL Intravenous UD  . sodium chloride  3 mL Intravenous Q12H  . vancomycin  1,500 mg Intravenous Q12H  . DISCONTD: amphotericin-B lipid  500 mg Intravenous Q24H  . DISCONTD: fluticasone  2 spray Each Nare Daily  . DISCONTD: simvastatin  20 mg Oral QHS  . DISCONTD: voriconazole (VFEND - PT WT > 85 kg) IV  4 mg/kg Intravenous Q12H  . DISCONTD: voriconazole (VFEND - PT WT > 85 kg) IV  6 mg/kg Intravenous Q12H    Principal Problem:  *6th nerve palsy Active Problems:  Headache  HTN (hypertension)  Sphenoid sinusitis   Assessment/Plan: #1. Headache sec to Extensive sphenoid sinusitis with extension to cavernous sinus: See MR report above. Patient underwent surgery last night with  sphenoidectomy and ethmoidectomy. May need transfer to another center per Dr. Ezzard Standing. Await further input. Continue with broad spectrum coverage. Appreciate ID input. Cultures pending. Has been afebrile.  #2. 6th Nerve palsy: Secondary to above.   #3. Uterine cancer status post hysterectomy and chemotherapy, last chemotherapy dose this April. Stable.  #4. Hypertension: Stable  #5. History of hyperlipidemia: on Zocor.  #6. DVT prophylaxis: SCD's  PT/OT when stable.  Overall patient is  doing well. Await further input from Dr. Ezzard Standing and other consultants. Will await labs from today.   LOS: 3 days   [facility] Pager [phone] 06/16/2011, 8:18 AM

## 2011-06-16 NOTE — Progress Notes (Signed)
Unable to pull out Pt's screen on PACU site, unable to request bed. Unable to pull Dr. Allene Pyo order that in the system( per MD). Called admitting MD, get order for tx to stepdown order.

## 2011-06-17 DIAGNOSIS — D491 Neoplasm of unspecified behavior of respiratory system: Secondary | ICD-10-CM | POA: Diagnosis present

## 2011-06-17 LAB — BASIC METABOLIC PANEL
Chloride: 99 mEq/L (ref 96–112)
Creatinine, Ser: 1.2 mg/dL — ABNORMAL HIGH (ref 0.50–1.10)
GFR calc Af Amer: 51 mL/min — ABNORMAL LOW (ref >90)
GFR calc non Af Amer: 44 mL/min — ABNORMAL LOW (ref >90)
Potassium: 3.7 mEq/L (ref 3.5–5.1)

## 2011-06-17 LAB — CBC
MCHC: 34.8 g/dL (ref 30.0–36.0)
MCV: 88.2 fL (ref 78.0–100.0)
Platelets: 161 10*3/uL (ref 150–400)
RDW: 13.6 % (ref 11.5–15.5)
WBC: 15.2 10*3/uL — ABNORMAL HIGH (ref 4.0–10.5)

## 2011-06-17 MED ORDER — CEPHALEXIN 500 MG PO CAPS: 500.0000 mg | ORAL_CAPSULE | Freq: Four times a day (QID) | ORAL | Status: AC

## 2011-06-17 MED ORDER — HYDROCODONE-ACETAMINOPHEN 5-500 MG PO TABS: 1.0000 | ORAL_TABLET | Freq: Four times a day (QID) | ORAL | Status: AC | PRN

## 2011-06-17 MED ORDER — HYDROCODONE-ACETAMINOPHEN 5-325 MG PO TABS
1.0000 | ORAL_TABLET | Freq: Four times a day (QID) | ORAL | Status: DC | PRN
  Administered 2011-06-17: 1 via ORAL
  Filled 2011-06-17: qty 1

## 2011-06-17 NOTE — Progress Notes (Signed)
POD 1 Minimal bleeding around merocel nasal packing. No airway problems. Can be discharged today. Will need f/u a [facility]. They should be contacting her. Include abx with dc meds either Kelex or Augmentin. Can contact me if any problems.

## 2011-06-17 NOTE — Discharge Summary (Signed)
 Physician Discharge Summary  Patient ID: [name] MRN: [id number] DOB/AGE: 10/04/1935 75 y.o.  Admit date: 06/13/2011 Discharge date: 06/17/2011  Admission Diagnoses: Headache 6th Nerve palsy  Discharge Diagnoses:  Principal Problem:  *Tumor of sphenoid sinus Active Problems:  Headache  HTN (hypertension)  6th nerve palsy  Discharged Condition: fair  Initial History: 75 year-old female with history of hypertension and history of uterine cancer status post hysterectomy last year followed by chemotherapy, the last one was this year in April, has been experiencing headache for the last 4 weeks and was given antibiotic course for sinusitis. Then the patient developed diplopia and [facility] had [facility] had a CAT scan of the head done. As per patient CAT scan head only showed sinusitis. Since then she's been having difficulty to abduct her right eye. She followed with the primary care physician who referred her to ophthalmologist and was told she had one of the nerves paralyzed. She had an appointment with neurologist. But the headache got worse so she came to the ER. Patient was found to have extensive sphenoid sinusitis extending into the right cavernous sinus requiring surgical intervention on 06/15/11.  Past medical history: Uterine Cancer and Hypertension   Consultants: Neurology, ENT (Dr. Newman), ID (Dr.'s Van Dam and Campbell)   Procedures: 06/15/11: Right endoscopic sinus surgery with right ethmoidectomy, right sphenoidectomy and biopsy of vascular tumor.  Hospital Course:   #1. Headache/Sinusitis: The patient underwent MRI of her brain to further evaluate the reason for her headache and also to evaluate a possible cause for 6 nerve palsy. MRI suggested that the patient had extensive sinusitis, extending from the sphenoid sinus into the cavernous sinus on the right side. There was also some concern about cerebritis in the left occipital area. This  prompted a consultation with ENT and Dr. Newman evaluate this patient. He took her to the OR and actually found that patient actually had a vascular tumor extending in her sinuses all the way to the cavernous sinus. According to Dr. Newman this will require evaluation at the [facility]. He has already contacted physicians over there and they will contact the patient and arrange followup. We monitored the patient in our hospital. Her blood counts remained stable. She was having minimal bleeding from her nostril, which is to be expected after this kind of surgery. Biopsies are still pending at this time. Patient may contact Dr. Newman if she has any trouble with bleeding at home.  #2. 6th Nerve palsy: This is due to the involvement with the vascular tumor as described above.  #3. Uterine cancer status post hysterectomy and chemotherapy, last chemotherapy dose this April. This issue is stable.   #4. Hypertension: Her blood pressures remain stable during this hospitalization.  #5. History of hyperlipidemia: Continue Zocor.   She did have a mild bump her in her creatinine to 1.21. Tthis was thought to be secondary to antibiotics. Antibiotics were stopped yesterday and her creatinine is again at 1.2. We anticipate this will improve in the following few days.  Patient has been ambulating in the room without any difficulty. She is considered stable for discharge.  PERTINENT LABS Her hemoglobin this morning is 11.2. White blood cell count is 15.2. Cultures and biopsy reports are still pending. Sodium is 133.   IMAGING STUDIES Ct Head Wo Contrast  06/14/2011  *RADIOLOGY REPORT*  Clinical Data: Headache and sinus pressure for several days.  CT HEAD WITHOUT   CONTRAST  Technique:  Contiguous axial images were obtained from the base of the skull through the vertex without contrast.  Comparison: CT of the head performed 12/18/2008  Findings: There is no evidence of  acute infarction, mass lesion, or intra- or extra-axial hemorrhage on CT.  Mild periventricular white matter change likely reflects small vessel ischemic microangiopathy.  The posterior fossa, including the cerebellum, brainstem and fourth ventricle, is within normal limits.  The third and lateral ventricles, and basal ganglia are unremarkable in appearance.  The cerebral hemispheres are symmetric in appearance, with normal gray- white differentiation.  No mass effect or midline shift is seen.  There is no evidence of fracture; visualized osseous structures are unremarkable in appearance.  The visualized portions of the orbits are within normal limits. There is near-complete opacification of the sphenoid sinus with slightly high attenuation fluid; the remaining paranasal sinuses and mastoid air cells are well-aerated.  There is new significant prominence of the adenoids bilaterally, with minimal associated calcification likely reflecting a tonsillolith.  IMPRESSION:  1.  No acute intracranial pathology seen on CT. 2.  Mild small vessel ischemic microangiopathy. 3.  New significant bilateral adenoid prominence, with tiny associated tonsillolith. 4.  Near complete opacification of the sphenoid sinus with slightly high attenuation fluid, just above the prominent adenoids.  This is also new from the prior study.  Original Report Authenticated By: JEFFREY CHANG, M.D.   Mr Angiogram Head Wo Contrast  06/15/2011  *RADIOLOGY REPORT*  Clinical Data:  Six nerve palsy right side.  Headache.  Sinus infection treated with antibiotics.  MRI HEAD WITHOUT AND WITH CONTRAST MRA HEAD WITHOUT CONTRAST MRA NECK WITHOUT AND WITH CONTRAST  Technique:  Multiplanar, multiecho pulse sequences of the brain and surrounding structures were obtained without and with intravenous contrast.  Angiographic images of the Circle of Willis were obtained using MRA technique without intravenous contrast. Angiographic images of the neck were obtained  using MRA technique without and with intravenous contrast.  Carotid stenosis measurements (when applicable) are obtained utilizing NASCET criteria, using the distal internal carotid diameter as the denominator.  Contrast: 20mL MULTIHANCE GADOBENATE DIMEGLUMINE 529 MG/ML IV SOLN  Comparison:  CT head 06/14/2011  MRI HEAD  Findings:  Complete opacification of the sphenoid sinus bilaterally.  There is thickening of the dura posterior to the clivus.  The dura is thickened and shows diffuse solid enhancement in this area.  Findings are compatible with sinusitis extending through the clivus to the dura.  No epidural abscess is identified. 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  Left cavernous sinus appears normal.  Mild mucosal edema in the ethmoid and maxillary sinuses bilaterally.  Mild mucosal edema in the mastoid sinuses bilaterally.  Abnormal signal in the left medial occipital lobe with gyriform cortical enhancement and surrounding white matter edema.  This area shows minimal restricted diffusion.  I would favor this represents cerebritis rather than infarction.  No other areas of abnormal enhancement in the brain.  2 mm restricted diffusion in the left parietal lobe may represent infarct or an early area of cerebritis.  IMPRESSION: Sphenoid sinusitis with extension through the clivus into the dura. No epidural abscess.  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  Gyriform enhancement left occipital lobe most likely related to cerebritis from infection.  Small focus of restricted diffusion left parietal lobe may   represent infarct or early cerebritis.  Consider fungal sinusitis.  MRA HEAD  Findings: Both vertebral arteries are patent to the basilar.  PICA is patent bilaterally.  Superior cerebellar and posterior cerebral arteries  are patent bilaterally.  Patent posterior communicating artery bilaterally.  Internal carotid artery is patent bilaterally.  No significant stenosis in the cavernous segment.  Anterior and middle cerebral arteries are patent bilaterally.  Negative for cerebral aneurysm.  IMPRESSION: Negative  MRA NEC  Findings: Both carotid arteries are widely patent without significant stenosis.  Both vertebral arteries are patent to the basilar without significant stenosis.  Left vertebral artery is dominant.  IMPRESSION: Negative  Critical Value/emergent results were called by telephone at the time of interpretation on 06/15/2011  at 0900 hours  to  Dr. Tyeasha Ebbs, who verbally acknowledged these results.  Original Report Authenticated By: DAVID C. CLARK, M.D.   Mr Angiogram Neck W Wo Contrast  06/15/2011   *RADIOLOGY REPORT*  Clinical Data:  Six nerve palsy right side.  Headache.  Sinus infection treated with antibiotics.  MRI HEAD WITHOUT AND WITH CONTRAST MRA HEAD WITHOUT CONTRAST MRA NECK WITHOUT AND WITH CONTRAST  Technique:  Multiplanar, multiecho pulse sequences of the brain and surrounding structures were obtained without and with intravenous contrast.  Angiographic images of the Circle of Willis were obtained using MRA technique without intravenous contrast. Angiographic images of the neck were obtained using MRA technique without and with intravenous contrast.  Carotid stenosis measurements (when applicable) are obtained utilizing NASCET criteria, using the distal internal carotid diameter as the denominator.  Contrast: 20mL MULTIHANCE GADOBENATE DIMEGLUMINE 529 MG/ML IV SOLN  Comparison:  CT head 06/14/2011  MRI HEAD  Findings:  Complete opacification of the sphenoid sinus bilaterally.  There is thickening of the dura posterior to the clivus.  The dura is thickened and shows diffuse solid enhancement in this area.  Findings are compatible with sinusitis extending through the clivus to the dura.  No epidural abscess is  identified. 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  Left cavernous sinus appears normal.  Mild mucosal edema in the ethmoid and maxillary sinuses bilaterally.  Mild mucosal edema in the mastoid sinuses bilaterally.  Abnormal signal in the left medial occipital lobe with gyriform cortical enhancement and surrounding white matter edema.  This area shows minimal restricted diffusion.  I would favor this represents cerebritis rather than infarction.  No other areas of abnormal enhancement in the brain.  2 mm restricted diffusion in the left parietal lobe may represent infarct or an early area of cerebritis.  IMPRESSION: Sphenoid sinusitis with extension through the clivus into the dura. No epidural abscess.  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  Gyriform enhancement left occipital lobe most likely related to cerebritis from infection.  Small focus of restricted diffusion left parietal lobe may represent infarct or early cerebritis.  Consider fungal sinusitis.  MRA HEAD  Findings: Both vertebral arteries are patent to the basilar.  PICA is patent bilaterally.  Superior cerebellar and posterior cerebral arteries are patent bilaterally.  Patent posterior communicating artery bilaterally.  Internal carotid artery is patent bilaterally.  No significant stenosis in the cavernous segment.  Anterior and middle cerebral arteries are patent bilaterally.  Negative for cerebral aneurysm.  IMPRESSION: Negative  MRA NEC  Findings: Both carotid arteries are widely patent without significant stenosis.    Both vertebral arteries are patent to the basilar without significant stenosis.  Left vertebral artery is dominant.  IMPRESSION: Negative  Critical Value/emergent results were called by telephone at the time of interpretation on 06/15/2011  at  0900 hours  to  Dr. Lottie Sigman, who verbally acknowledged these results.  Original Report Authenticated By: DAVID C. CLARK, M.D.   Mr Brain W Wo Contrast  06/15/2011  *RADIOLOGY REPORT*  Clinical Data:  Six nerve palsy right side.  Headache.  Sinus infection treated with antibiotics.  MRI HEAD WITHOUT AND WITH CONTRAST MRA HEAD WITHOUT CONTRAST MRA NECK WITHOUT AND WITH CONTRAST  Technique:  Multiplanar, multiecho pulse sequences of the brain and surrounding structures were obtained without and with intravenous contrast.  Angiographic images of the Circle of Willis were obtained using MRA technique without intravenous contrast. Angiographic images of the neck were obtained using MRA technique without and with intravenous contrast.  Carotid stenosis measurements (when applicable) are obtained utilizing NASCET criteria, using the distal internal carotid diameter as the denominator.  Contrast: 20mL MULTIHANCE GADOBENATE DIMEGLUMINE 529 MG/ML IV SOLN  Comparison:  CT head 06/14/2011  MRI HEAD  Findings:  Complete opacification of the sphenoid sinus bilaterally.  There is thickening of the dura posterior to the clivus.  The dura is thickened and shows diffuse solid enhancement in this area.  Findings are compatible with sinusitis extending through the clivus to the dura.  No epidural abscess is identified. 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  Left cavernous sinus appears normal.  Mild mucosal edema in the ethmoid and maxillary sinuses bilaterally.  Mild mucosal edema in the mastoid sinuses bilaterally.  Abnormal signal in the left medial occipital lobe with gyriform cortical enhancement and surrounding white matter edema.  This area shows minimal restricted diffusion.  I would favor this represents cerebritis rather than infarction.  No other areas of abnormal enhancement in the brain.  2 mm restricted  diffusion in the left parietal lobe may represent infarct or an early area of cerebritis.  IMPRESSION: Sphenoid sinusitis with extension through the clivus into the dura. No epidural abscess.  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  Gyriform enhancement left occipital lobe most likely related to cerebritis from infection.  Small focus of restricted diffusion left parietal lobe may represent infarct or early cerebritis.  Consider fungal sinusitis.  MRA HEAD  Findings: Both vertebral arteries are patent to the basilar.  PICA is patent bilaterally.  Superior cerebellar and posterior cerebral arteries are patent bilaterally.  Patent posterior communicating artery bilaterally.  Internal carotid artery is patent bilaterally.  No significant stenosis in the cavernous segment.  Anterior and middle cerebral arteries are patent bilaterally.  Negative for cerebral aneurysm.  IMPRESSION: Negative  MRA NEC  Findings: Both carotid arteries are widely patent without significant stenosis.  Both vertebral arteries are patent to the basilar without significant stenosis.  Left vertebral artery is dominant.  IMPRESSION: Negative  Critical Value/emergent results were called by telephone at the time of interpretation on 06/15/2011  at 0900 hours  to  Dr. Kathelyn Gombos, who verbally acknowledged these results.  Original Report Authenticated By: DAVID C. CLARK, M.D.    Discharge Exam: Please see progress note from today.  Disposition: Home  Discharge Orders    Future Orders Please Complete By Expires   Diet - low sodium heart healthy        Increase activity slowly      Discharge instructions      Comments:   Call Dr. Newman if you any problems with bleeding or the nasal packing. Expect a call from [facility] ENT department.     Current Discharge Medication List    START taking these medications   Details  cephALEXin (KEFLEX) 500 MG capsule Take 1  capsule (500 mg total) by mouth 4 (four) times daily. Qty: 24 capsule, Refills: 0    HYDROcodone-acetaminophen (VICODIN) 5-500 MG per tablet Take 1 tablet by mouth every 6 (six) hours as needed for pain. Qty: 30 tablet, Refills: 0      CONTINUE these medications which have NOT CHANGED   Details  acetaminophen (TYLENOL) 500 MG tablet Take 500 mg by mouth every 6 (six) hours as needed. For pain     fish oil-omega-3 fatty acids 1000 MG capsule Take 1 g by mouth 2 (two) times daily.      LORazepam (ATIVAN) 0.5 MG tablet Take 0.5 mg by mouth every 8 (eight) hours. For vertigo     losartan-hydrochlorothiazide (HYZAAR) 50-12.5 MG per tablet Take 1 tablet by mouth daily.      Multiple Vitamins-Minerals (MULTIVITAMINS THER. W/MINERALS) TABS Take 1 tablet by mouth daily.      simvastatin (ZOCOR) 20 MG tablet Take 20 mg by mouth at bedtime.        STOP taking these medications     aspirin EC 81 MG tablet      fluticasone (FLONASE) 50 MCG/ACT nasal spray      guaiFENesin (MUCINEX) 600 MG 12 hr tablet      ibuprofen (ADVIL,MOTRIN) 200 MG tablet      OVER THE COUNTER MEDICATION        Follow-up Information    Follow up with NEWMAN, CHRISTOPHER E, MD. (Call for any problem with bleeding from nose)    Contact information:   Individual [address] [place] [address] [phone]       Follow up with HOWARD,KEVIN P. Make an appointment in 3 weeks.   Contact information:   [address] [place] [address] [phone]          Total Discharge Time: 35 mins  Signed: Lora Glomski  [facility] Pager [phone]  06/17/2011, 2:11 PM   

## 2011-06-17 NOTE — Progress Notes (Signed)
PCP: Dr. Dimas Aguas at [facility] in [place], [place]  Brief narrative:  75 year-old female with history of hypertension and history of uterine cancer status post hysterectomy last year followed by chemotherapy, the last one was this year in April, has been experiencing headache for the last 4 weeks and was given antibiotic course for sinusitis. Then the patient developed diplopia and she had gone to [facility] where she had a CAT scan of the head done. As per patient CAT scan head only showed sinusitis. Since then she's been having difficulty to abduct her right eye. She followed with the primary care physician who referred her to ophthalmologist and was told she had one of the nerves paralyzed. She had an appointment with neurologist. But the headache got worse so she came to the ER. Patient was found to have extensive sphenoid sinusitis extending into the right cavernous sinus requiring surgical intervention on 06/15/11. However it appears she has a vascular tumor rather than infection. Will need f/u at [facility] arranged by Dr. Ezzard Standing.  Past medical history: Uterine Cancer and Hypertension  Consultants: Neurology, ENT (Dr. Ezzard Standing), ID Zenaida N[facility] [place])  Procedures: 06/15/11: Right endoscopic sinus surgery with right ethmoidectomy, right sphenoidectomy and biopsy of vascular tumor.  Subjective: Has minimal bleeding from right nostril. Headache is better. Concerned about the tumor.   Objective: Vital signs in last 24 hours: Temp:  [97.5 F (36.4 C)-98.2 F (36.8 C)] 97.9 F (36.6 C) (12/09 0800) Pulse Rate:  [82-89] 83  (12/09 0800) Resp:  [25-29] 25  (12/09 0800) BP: (112-130)/(39-54) 117/39 mmHg (12/09 0800) SpO2:  [94 %-95 %] 95 % (12/09 0800) Weight change:  Last BM Date: 06/15/11  Intake/Output from previous day: 12/08 0701 - 12/09 0700 In: 3167.1 [P.O.:240; I.V.:2927.1] Out: 2025 [Urine:2025] Intake/Output this shift:    General appearance: alert, cooperative and no distress Head:  Normocephalic, without obvious abnormality, atraumatic Eyes: Right eye does not abduct. Resp: clear to auscultation bilaterally Cardio: regular rate and rhythm, S1, S2 normal, no murmur, click, rub or gallop Extremities: extremities normal, atraumatic, no cyanosis or edema Pulses: 2+ and symmetric Neurologic: Grossly normal  Lab Results:  Basename 06/17/11 0300 06/16/11 0730  WBC 15.2* 15.4*  HGB 11.2* 12.5  HCT 32.2* 36.4  PLT 161 176   BMET  Basename 06/16/11 0730 06/15/11 0600  NA 133* 134*  K 3.7 3.8  CL 100 97  CO2 21 28  GLUCOSE 159* 102*  BUN 17 13  CREATININE 1.21* 0.87  CALCIUM 7.8* 9.0    Studies/Results: No results found.  Medications:  Scheduled:    . guaiFENesin  600 mg Oral BID  . hydrochlorothiazide  12.5 mg Oral Daily  . LORazepam  0.5 mg Oral Q8H  . losartan  50 mg Oral Daily  . multivitamins ther. w/minerals  1 tablet Oral Daily  . omega-3 acid ethyl esters  1 g Oral BID  . sodium chloride  500 mL Intravenous UD  . sodium chloride  3 mL Intravenous Q12H  . DISCONTD: imipenem-cilastatin  500 mg Intravenous Q6H  . DISCONTD: imipenem-cilastatin  500 mg Intravenous Q8H  . DISCONTD: vancomycin  1,500 mg Intravenous Q12H    Principal Problem:  *6th nerve palsy Active Problems:  Headache  HTN (hypertension)  Sphenoid sinusitis   Assessment/Plan: #1. Headache sec to vascular tumor involving the sinuses with extension to cavernous sinus: Not evidence for infection. See MR report above. S/p surgery with sphenoidectomy and ethmoidectomy. Stopped antibiotics per ID. Per Dr. Ezzard Standing she will  need f/u at [facility] which he will arrange.  #2. 6th Nerve palsy: Secondary to above.   #3. Uterine cancer status post hysterectomy and chemotherapy, last chemotherapy dose this April. Stable.  #4. Hypertension: Stable  #5. History of hyperlipidemia: on Zocor.  #6. DVT prophylaxis: SCD's  PT/OT when stable.  Will check Bmet to make sure renal function is  stable. Then she can be discharged, Little bleeding from nostril is expected. Discussed with Dr. Ezzard Standing.   LOS: 4 days   [facility] Pager [phone] 06/17/2011, 9:02 AM

## 2011-06-17 NOTE — Progress Notes (Deleted)
Physician Discharge Summary  Patient ID: THURLEY FRANCESCONI MRN: [id number] DOB/AGE: Nov 14, 1935 75 y.o.  Admit date: 06/13/2011 Discharge date: 06/17/2011  Admission Diagnoses: Headache 6th Nerve palsy  Discharge Diagnoses:  Principal Problem:  *Tumor of sphenoid sinus Active Problems:  Headache  HTN (hypertension)  6th nerve palsy  Discharged Condition: fair  Initial History: 75 year-old female with history of hypertension and history of uterine cancer status post hysterectomy last year followed by chemotherapy, the last one was this year in April, has been experiencing headache for the last 4 weeks and was given antibiotic course for sinusitis. Then the patient developed diplopia and she had gone to [facility] hospital where she had a CAT scan of the head done. As per patient CAT scan head only showed sinusitis. Since then she's been having difficulty to abduct her right eye. She followed with the primary care physician who referred her to ophthalmologist and was told she had one of the nerves paralyzed. She had an appointment with neurologist. But the headache got worse so she came to the ER. Patient was found to have extensive sphenoid sinusitis extending into the right cavernous sinus requiring surgical intervention on 06/15/11.  Past medical history: Uterine Cancer and Hypertension   Consultants: Neurology, ENT (Dr. Ezzard Standing), ID (Dr.'s Daiva [facility])   Procedures: 06/15/11: Right endoscopic sinus surgery with right ethmoidectomy, right sphenoidectomy and biopsy of vascular tumor.  Hospital Course:   #1. Headache/Sinusitis: The patient underwent MRI of her brain to further evaluate the reason for her headache and also to evaluate a possible cause for 6 nerve palsy. MRI suggested that the patient had extensive sinusitis, extending from the sphenoid sinus into the cavernous sinus on the right side. There was also some concern about cerebritis in the left occipital area. This  prompted a consultation with ENT and Dr. Ezzard Standing evaluate this patient. He took her to the OR and actually found that patient actually had a vascular tumor extending in her sinuses all the way to the cavernous sinus. According to Dr. Ezzard Standing this will require evaluation at the [facility]. He has already contacted physicians over there and they will contact the patient and arrange followup. We monitored the patient in our hospital. Her blood counts remained stable. She was having minimal bleeding from her nostril, which is to be expected after this kind of surgery. Biopsies are still pending at this time. Patient may contact Dr. Ezzard Standing if she has any trouble with bleeding at home.  #2. 6th Nerve palsy: This is due to the involvement with the vascular tumor as described above.  #3. Uterine cancer status post hysterectomy and chemotherapy, last chemotherapy dose this April. This issue is stable.   #4. Hypertension: Her blood pressures remain stable during this hospitalization.  #5. History of hyperlipidemia: Continue Zocor.   She did have a mild bump her in her creatinine to 1.21. Tthis was thought to be secondary to antibiotics. Antibiotics were stopped yesterday and her creatinine is again at 1.2. We anticipate this will improve in the following few days.  Patient has been ambulating in the room without any difficulty. She is considered stable for discharge.  PERTINENT LABS Her hemoglobin this morning is 11.2. White blood cell count is 15.2. Cultures and biopsy reports are still pending. Sodium is 133.   IMAGING STUDIES Ct Head Wo Contrast  06/14/2011  *RADIOLOGY REPORT*  Clinical Data: Headache and sinus pressure for several days.  CT HEAD WITHOUT  CONTRAST  Technique:  Contiguous axial images were obtained from the base of the skull through the vertex without contrast.  Comparison: CT of the head performed 12/18/2008  Findings: There is no evidence of  acute infarction, mass lesion, or intra- or extra-axial hemorrhage on CT.  Mild periventricular white matter change likely reflects small vessel ischemic microangiopathy.  The posterior fossa, including the cerebellum, brainstem and fourth ventricle, is within normal limits.  The third and lateral ventricles, and basal ganglia are unremarkable in appearance.  The cerebral hemispheres are symmetric in appearance, with normal gray- white differentiation.  No mass effect or midline shift is seen.  There is no evidence of fracture; visualized osseous structures are unremarkable in appearance.  The visualized portions of the orbits are within normal limits. There is near-complete opacification of the sphenoid sinus with slightly high attenuation fluid; the remaining paranasal sinuses and mastoid air cells are well-aerated.  There is new significant prominence of the adenoids bilaterally, with minimal associated calcification likely reflecting a tonsillolith.  IMPRESSION:  1.  No acute intracranial pathology seen on CT. 2.  Mild small vessel ischemic microangiopathy. 3.  New significant bilateral adenoid prominence, with tiny associated tonsillolith. 4.  Near complete opacification of the sphenoid sinus with slightly high attenuation fluid, just above the prominent adenoids.  This is also new from the prior study.  Original Report Authenticated By: Tonia Ghent, M.D.   Mr Angiogram Head Wo Contrast  06/15/2011  *RADIOLOGY REPORT*  Clinical Data:  Six nerve palsy right side.  Headache.  Sinus infection treated with antibiotics.  MRI HEAD WITHOUT AND WITH CONTRAST MRA HEAD WITHOUT CONTRAST MRA NECK WITHOUT AND WITH CONTRAST  Technique:  Multiplanar, multiecho pulse sequences of the brain and surrounding structures were obtained without and with intravenous contrast.  Angiographic images of the Circle of Willis were obtained using MRA technique without intravenous contrast. Angiographic images of the neck were obtained  using MRA technique without and with intravenous contrast.  Carotid stenosis measurements (when applicable) are obtained utilizing NASCET criteria, using the distal internal carotid diameter as the denominator.  Contrast: 20mL MULTIHANCE GADOBENATE DIMEGLUMINE 529 MG/ML IV SOLN  Comparison:  CT head 06/14/2011  MRI HEAD  Findings:  Complete opacification of the sphenoid sinus bilaterally.  There is thickening of the dura posterior to the clivus.  The dura is thickened and shows diffuse solid enhancement in this area.  Findings are compatible with sinusitis extending through the clivus to the dura.  No epidural abscess is identified. 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  Left cavernous sinus appears normal.  Mild mucosal edema in the ethmoid and maxillary sinuses bilaterally.  Mild mucosal edema in the mastoid sinuses bilaterally.  Abnormal signal in the left medial occipital lobe with gyriform cortical enhancement and surrounding white matter edema.  This area shows minimal restricted diffusion.  I would favor this represents cerebritis rather than infarction.  No other areas of abnormal enhancement in the brain.  2 mm restricted diffusion in the left parietal lobe may represent infarct or an early area of cerebritis.  IMPRESSION: Sphenoid sinusitis with extension through the clivus into the dura. No epidural abscess.  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  Gyriform enhancement left occipital lobe most likely related to cerebritis from infection.  Small focus of restricted diffusion left parietal lobe may  represent infarct or early cerebritis.  Consider fungal sinusitis.  MRA HEAD  Findings: Both vertebral arteries are patent to the basilar.  PICA is patent bilaterally.  Superior cerebellar and posterior cerebral arteries  are patent bilaterally.  Patent posterior communicating artery bilaterally.  Internal carotid artery is patent bilaterally.  No significant stenosis in the cavernous segment.  Anterior and middle cerebral arteries are patent bilaterally.  Negative for cerebral aneurysm.  IMPRESSION: Negative  MRA NEC  Findings: Both carotid arteries are widely patent without significant stenosis.  Both vertebral arteries are patent to the basilar without significant stenosis.  Left vertebral artery is dominant.  IMPRESSION: Negative  Critical Value/emergent results were called by telephone at the time of interpretation on 06/15/2011  at 0900 hours  to  Dr. Rito Ehrlich, who verbally acknowledged these results.  Original Report Authenticated By: Camelia Phenes, M.D.   Mr Angiogram Neck W Wo Contrast  06/15/2011   *RADIOLOGY REPORT*  Clinical Data:  Six nerve palsy right side.  Headache.  Sinus infection treated with antibiotics.  MRI HEAD WITHOUT AND WITH CONTRAST MRA HEAD WITHOUT CONTRAST MRA NECK WITHOUT AND WITH CONTRAST  Technique:  Multiplanar, multiecho pulse sequences of the brain and surrounding structures were obtained without and with intravenous contrast.  Angiographic images of the Circle of Willis were obtained using MRA technique without intravenous contrast. Angiographic images of the neck were obtained using MRA technique without and with intravenous contrast.  Carotid stenosis measurements (when applicable) are obtained utilizing NASCET criteria, using the distal internal carotid diameter as the denominator.  Contrast: 20mL MULTIHANCE GADOBENATE DIMEGLUMINE 529 MG/ML IV SOLN  Comparison:  CT head 06/14/2011  MRI HEAD  Findings:  Complete opacification of the sphenoid sinus bilaterally.  There is thickening of the dura posterior to the clivus.  The dura is thickened and shows diffuse solid enhancement in this area.  Findings are compatible with sinusitis extending through the clivus to the dura.  No epidural abscess is  identified. 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  Left cavernous sinus appears normal.  Mild mucosal edema in the ethmoid and maxillary sinuses bilaterally.  Mild mucosal edema in the mastoid sinuses bilaterally.  Abnormal signal in the left medial occipital lobe with gyriform cortical enhancement and surrounding white matter edema.  This area shows minimal restricted diffusion.  I would favor this represents cerebritis rather than infarction.  No other areas of abnormal enhancement in the brain.  2 mm restricted diffusion in the left parietal lobe may represent infarct or an early area of cerebritis.  IMPRESSION: Sphenoid sinusitis with extension through the clivus into the dura. No epidural abscess.  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  Gyriform enhancement left occipital lobe most likely related to cerebritis from infection.  Small focus of restricted diffusion left parietal lobe may represent infarct or early cerebritis.  Consider fungal sinusitis.  MRA HEAD  Findings: Both vertebral arteries are patent to the basilar.  PICA is patent bilaterally.  Superior cerebellar and posterior cerebral arteries are patent bilaterally.  Patent posterior communicating artery bilaterally.  Internal carotid artery is patent bilaterally.  No significant stenosis in the cavernous segment.  Anterior and middle cerebral arteries are patent bilaterally.  Negative for cerebral aneurysm.  IMPRESSION: Negative  MRA NEC  Findings: Both carotid arteries are widely patent without significant stenosis.  Both vertebral arteries are patent to the basilar without significant stenosis.  Left vertebral artery is dominant.  IMPRESSION: Negative  Critical Value/emergent results were called by telephone at the time of interpretation on 06/15/2011  at  0900 hours  to  Dr. Rito Ehrlich, who verbally acknowledged these results.  Original Report Authenticated By: Camelia Phenes, M.D.   Mr Laqueta Jean Wo Contrast  06/15/2011  *RADIOLOGY REPORT*  Clinical Data:  Six nerve palsy right side.  Headache.  Sinus infection treated with antibiotics.  MRI HEAD WITHOUT AND WITH CONTRAST MRA HEAD WITHOUT CONTRAST MRA NECK WITHOUT AND WITH CONTRAST  Technique:  Multiplanar, multiecho pulse sequences of the brain and surrounding structures were obtained without and with intravenous contrast.  Angiographic images of the Circle of Willis were obtained using MRA technique without intravenous contrast. Angiographic images of the neck were obtained using MRA technique without and with intravenous contrast.  Carotid stenosis measurements (when applicable) are obtained utilizing NASCET criteria, using the distal internal carotid diameter as the denominator.  Contrast: 20mL MULTIHANCE GADOBENATE DIMEGLUMINE 529 MG/ML IV SOLN  Comparison:  CT head 06/14/2011  MRI HEAD  Findings:  Complete opacification of the sphenoid sinus bilaterally.  There is thickening of the dura posterior to the clivus.  The dura is thickened and shows diffuse solid enhancement in this area.  Findings are compatible with sinusitis extending through the clivus to the dura.  No epidural abscess is identified. 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  Left cavernous sinus appears normal.  Mild mucosal edema in the ethmoid and maxillary sinuses bilaterally.  Mild mucosal edema in the mastoid sinuses bilaterally.  Abnormal signal in the left medial occipital lobe with gyriform cortical enhancement and surrounding white matter edema.  This area shows minimal restricted diffusion.  I would favor this represents cerebritis rather than infarction.  No other areas of abnormal enhancement in the brain.  2 mm restricted  diffusion in the left parietal lobe may represent infarct or an early area of cerebritis.  IMPRESSION: Sphenoid sinusitis with extension through the clivus into the dura. No epidural abscess.  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  Gyriform enhancement left occipital lobe most likely related to cerebritis from infection.  Small focus of restricted diffusion left parietal lobe may represent infarct or early cerebritis.  Consider fungal sinusitis.  MRA HEAD  Findings: Both vertebral arteries are patent to the basilar.  PICA is patent bilaterally.  Superior cerebellar and posterior cerebral arteries are patent bilaterally.  Patent posterior communicating artery bilaterally.  Internal carotid artery is patent bilaterally.  No significant stenosis in the cavernous segment.  Anterior and middle cerebral arteries are patent bilaterally.  Negative for cerebral aneurysm.  IMPRESSION: Negative  MRA NEC  Findings: Both carotid arteries are widely patent without significant stenosis.  Both vertebral arteries are patent to the basilar without significant stenosis.  Left vertebral artery is dominant.  IMPRESSION: Negative  Critical Value/emergent results were called by telephone at the time of interpretation on 06/15/2011  at 0900 hours  to  Dr. Rito Ehrlich, who verbally acknowledged these results.  Original Report Authenticated By: Camelia Phenes, M.D.    Discharge Exam: Please see progress note from today.  Disposition: Home  Discharge Orders    Future Orders Please Complete By Expires   Diet - low sodium heart healthy  Increase activity slowly      Discharge instructions      Comments:   Call Dr. Ezzard Standing if you any problems with bleeding or the nasal packing. Expect a call from [facility] E[facility] department.     Current Discharge Medication List    START taking these medications   Details  cephALEXin (KEFLEX) 500 MG capsule Take 1  capsule (500 mg total) by mouth 4 (four) times daily. Qty: 24 capsule, Refills: 0    HYDROcodone-acetaminophen (VICODIN) 5-500 MG per tablet Take 1 tablet by mouth every 6 (six) hours as needed for pain. Qty: 30 tablet, Refills: 0      CONTINUE these medications which have NOT CHANGED   Details  acetaminophen (TYLENOL) 500 MG tablet Take 500 mg by mouth every 6 (six) hours as needed. For pain     fish oil-omega-3 fatty acids 1000 MG capsule Take 1 g by mouth 2 (two) times daily.      LORazepam (ATIVAN) 0.5 MG tablet Take 0.5 mg by mouth every 8 (eight) hours. For vertigo     losartan-hydrochlorothiazide (HYZAAR) 50-12.5 MG per tablet Take 1 tablet by mouth daily.      Multiple Vitamins-Minerals (MULTIVITAMINS THER. W/MINERALS) TABS Take 1 tablet by mouth daily.      simvastatin (ZOCOR) 20 MG tablet Take 20 mg by mouth at bedtime.        STOP taking these medications     aspirin EC 81 MG tablet      fluticasone (FLONASE) 50 MCG/ACT nasal spray      guaiFENesin (MUCINEX) 600 MG 12 hr tablet      ibuprofen (ADVIL,MOTRIN) 200 MG tablet      OVER THE COUNTER MEDICATION        Follow-up Information    Follow up with NEWMAN, CHRISTOPHER E, MD. (Call for any problem with bleeding from nose)    Contact information:   Individual [address] [place] [address] [phone]       Follow up with HOWARD,KEVIN P. Make an appointment in 3 weeks.   Contact information:   [address] [place] [address] [phone]          Total Discharge Time: 35 mins  Signed: [facility] Pager [phone]  06/17/2011, 2:11 PM

## 2011-06-18 ENCOUNTER — Encounter (HOSPITAL_COMMUNITY): Payer: Self-pay | Admitting: Otolaryngology

## 2011-06-19 LAB — TISSUE CULTURE: Culture: NO GROWTH

## 2011-06-21 LAB — CULTURE, BLOOD (ROUTINE X 2)
Culture  Setup Time: 201212072253
Culture  Setup Time: 201212072253
Culture: NO GROWTH

## 2011-06-21 LAB — ANAEROBIC CULTURE: Gram Stain: NONE SEEN

## 2011-06-22 ENCOUNTER — Encounter: Payer: Self-pay | Admitting: Cardiology

## 2011-06-22 LAB — FUNGUS CULTURE, BLOOD

## 2011-07-13 LAB — FUNGUS CULTURE W SMEAR: Fungal Smear: NONE SEEN

## 2012-03-14 DIAGNOSIS — M549 Dorsalgia, unspecified: Secondary | ICD-10-CM

## 2012-06-08 DEATH — deceased

## 2016-05-10 ENCOUNTER — Telehealth: Payer: Self-pay

## 2016-05-10 NOTE — Telephone Encounter (Signed)
Recall for tcs °

## 2016-05-10 NOTE — Telephone Encounter (Signed)
Letter mailed to pt.
